# Patient Record
Sex: Male | Born: 1975 | Race: White | Hispanic: No | Smoking: Current every day smoker
Health system: Southern US, Community
[De-identification: ages and names within clinical notes are randomized; demographics above are authoritative.]

## PROBLEM LIST (undated history)

## (undated) DIAGNOSIS — K7460 Unspecified cirrhosis of liver: Secondary | ICD-10-CM

## (undated) HISTORY — PX: PERITONEAL CATHETER INSERTION: SHX2223

## (undated) HISTORY — PX: APPENDECTOMY: SHX54

---

## 2019-01-15 ENCOUNTER — Emergency Department (HOSPITAL_BASED_OUTPATIENT_CLINIC_OR_DEPARTMENT_OTHER): Payer: Self-pay

## 2019-01-15 ENCOUNTER — Encounter (HOSPITAL_BASED_OUTPATIENT_CLINIC_OR_DEPARTMENT_OTHER): Payer: Self-pay | Admitting: Emergency Medicine

## 2019-01-15 ENCOUNTER — Emergency Department (HOSPITAL_BASED_OUTPATIENT_CLINIC_OR_DEPARTMENT_OTHER)
Admission: EM | Admit: 2019-01-15 | Discharge: 2019-01-15 | Disposition: A | Discharge status: Home / Self Care | Payer: Self-pay | Attending: Emergency Medicine | Admitting: Emergency Medicine

## 2019-01-15 ENCOUNTER — Other Ambulatory Visit: Payer: Self-pay

## 2019-01-15 DIAGNOSIS — F17210 Nicotine dependence, cigarettes, uncomplicated: Secondary | ICD-10-CM | POA: Insufficient documentation

## 2019-01-15 DIAGNOSIS — R0789 Other chest pain: Secondary | ICD-10-CM | POA: Insufficient documentation

## 2019-01-15 DIAGNOSIS — J020 Streptococcal pharyngitis: Secondary | ICD-10-CM | POA: Insufficient documentation

## 2019-01-15 HISTORY — DX: Unspecified cirrhosis of liver: K74.60

## 2019-01-15 LAB — GROUP A STREP BY PCR: Group A Strep by PCR: DETECTED — AB

## 2019-01-15 LAB — COMPREHENSIVE METABOLIC PANEL
ALT: 41 U/L (ref 0–44)
AST: 60 U/L — ABNORMAL HIGH (ref 15–41)
Albumin: 3.5 g/dL (ref 3.5–5.0)
Alkaline Phosphatase: 117 U/L (ref 38–126)
Anion gap: 9 (ref 5–15)
BUN: 10 mg/dL (ref 6–20)
CO2: 23 mmol/L (ref 22–32)
Calcium: 9.4 mg/dL (ref 8.9–10.3)
Chloride: 108 mmol/L (ref 98–111)
Creatinine, Ser: 0.66 mg/dL (ref 0.61–1.24)
GFR calc Af Amer: >60 mL/min (ref >60)
GFR calc non Af Amer: >60 mL/min (ref >60)
Glucose, Bld: 125 mg/dL — ABNORMAL HIGH (ref 70–99)
Potassium: 3.7 mmol/L (ref 3.5–5.1)
Sodium: 140 mmol/L (ref 135–145)
Total Bilirubin: 1.1 mg/dL (ref 0.3–1.2)
Total Protein: 6.9 g/dL (ref 6.5–8.1)

## 2019-01-15 LAB — CBC WITH DIFFERENTIAL/PLATELET
Abs Immature Granulocytes: 0.05 10*3/uL (ref 0.00–0.07)
Basophils Absolute: 0.1 10*3/uL (ref 0.0–0.1)
Basophils Relative: 1 %
Eosinophils Absolute: 0.2 10*3/uL (ref 0.0–0.5)
Eosinophils Relative: 3 %
HCT: 49.4 % (ref 39.0–52.0)
Hemoglobin: 16.3 g/dL (ref 13.0–17.0)
Immature Granulocytes: 1 %
Lymphocytes Relative: 22 %
Lymphs Abs: 2.1 10*3/uL (ref 0.7–4.0)
MCH: 31.3 pg (ref 26.0–34.0)
MCHC: 33 g/dL (ref 30.0–36.0)
MCV: 94.8 fL (ref 80.0–100.0)
Monocytes Absolute: 1.1 10*3/uL — ABNORMAL HIGH (ref 0.1–1.0)
Monocytes Relative: 12 %
Neutro Abs: 5.8 10*3/uL (ref 1.7–7.7)
Neutrophils Relative %: 61 %
Platelets: 66 10*3/uL — ABNORMAL LOW (ref 150–400)
RBC: 5.21 MIL/uL (ref 4.22–5.81)
RDW: 13.8 % (ref 11.5–15.5)
Smear Review: DECREASED
WBC: 9.3 10*3/uL (ref 4.0–10.5)
nRBC: 0 % (ref 0.0–0.2)

## 2019-01-15 LAB — TROPONIN I (HIGH SENSITIVITY)
Troponin I (High Sensitivity): 23 ng/L — ABNORMAL HIGH (ref <18)
Troponin I (High Sensitivity): 21 ng/L — ABNORMAL HIGH (ref <18)

## 2019-01-15 MED ORDER — DEXAMETHASONE SODIUM PHOSPHATE 10 MG/ML IJ SOLN
10.0000 mg | Freq: Once | INTRAMUSCULAR | Status: AC
  Administered 2019-01-15: 05:00:00 10 mg via INTRAVENOUS
  Filled 2019-01-15: qty 1

## 2019-01-15 MED ORDER — PENICILLIN G BENZATHINE 1200000 UNIT/2ML IM SUSP
1.2000 10*6.[IU] | Freq: Once | INTRAMUSCULAR | Status: AC
  Administered 2019-01-15: 05:00:00 1.2 10*6.[IU] via INTRAMUSCULAR
  Filled 2019-01-15: qty 2

## 2019-01-15 MED ORDER — SODIUM CHLORIDE 0.9 % IV BOLUS (SEPSIS)
1000.0000 mL | Freq: Once | INTRAVENOUS | Status: AC
  Administered 2019-01-15: 05:00:00 1000 mL via INTRAVENOUS

## 2019-01-15 MED ORDER — KETOROLAC TROMETHAMINE 30 MG/ML IJ SOLN
30.0000 mg | Freq: Once | INTRAMUSCULAR | Status: AC
  Administered 2019-01-15: 05:00:00 30 mg via INTRAVENOUS
  Filled 2019-01-15: qty 1

## 2019-01-15 NOTE — Discharge Instructions (Addendum)
You may alternate Tylenol 1000 mg every 6 hours as needed for pain and Ibuprofen 800 mg every 8 hours as needed for pain.  Please take Ibuprofen with food. ° °Steps to find a Primary Care Provider (PCP): ° °Call [phone] or [phone] to access "[place] [place] Find a Doctor Service." ° °2.  You may also go on the [facility] website at [email or web address] ° °3.  [facility] also frequently accepts new patients. ° °[facility]  °[address] °[place] [address] °[phone] ° °4.  There are also multiple [facility], [facility] and [facility] practices throughout [place] that are frequently accepting new patients. You may find a clinic that is close to your home and contact them. ° °[facility] °[email or web address] °[phone] ° °[facility] °[email or web address] ° °[facility] °[email or web address] °[phone] ° °[facility] °[email or web address] °[phone] ° °5.  Local Health Departments also can provide primary care services. ° °[facility]  °[address] °[place] [address] [phone] ° °[facility] °[address] °[place] [address] [phone] ° °[facility] °[address]  °[place] [address] °[phone] ° ° °

## 2019-01-15 NOTE — ED Notes (Signed)
ED Provider at bedside. 

## 2019-01-15 NOTE — ED Triage Notes (Signed)
Chest pain shortness of breath and sore throat for 5 days. Denies fevers at home. Hx cirrhosis. Denies cardiac hx.

## 2019-01-15 NOTE — ED Notes (Signed)
Also complaining of headache for seven days.

## 2019-01-15 NOTE — ED Provider Notes (Signed)
CHIEF COMPLAINT: Cough, chest pain, shortness of breath, sore throat, body aches, chills  HPI: Patient is a 43 year old male with history of cirrhosis, obesity who presents to the emergency department with 3 days of nonproductive cough, diffuse chest tightness, shortness of breath, sore throat, body aches, chills.  No known fevers.  No vomiting or diarrhea.  No abdominal pain.  States he just moved here from [place].  No sick contacts.  He is not aware of any COVID exposures.  No history of CAD, PE or DVT.  ROS: See HPI Constitutional: no fever  Eyes: no drainage  ENT: no runny nose   Cardiovascular:   chest pain  Resp:  SOB  GI: no vomiting GU: no dysuria Integumentary: no rash  Allergy: no hives  Musculoskeletal: no leg swelling  Neurological: no slurred speech ROS otherwise negative  PAST MEDICAL HISTORY/PAST SURGICAL HISTORY:  Past Medical History:  Diagnosis Date  . Cirrhosis (HCC)     MEDICATIONS:  Prior to Admission medications   Medication Sig Start Date End Date Taking? Authorizing Provider  acetaminophen (TYLENOL) 325 MG tablet Take 650 mg by mouth every 6 (six) hours as needed for moderate pain.   Yes [provider]    ALLERGIES:  No Known Allergies  SOCIAL HISTORY:  Social History   Tobacco Use  . Smoking status: Current Every Day Smoker    Packs/day: 0.50    Types: Cigarettes  . Smokeless tobacco: Never Used  Substance Use Topics  . Alcohol use: Yes    Comment: socially    FAMILY HISTORY: No family history on file.  EXAM: BP (!) 162/100 (BP Location: Right Arm)   Pulse 100   Temp 98.5 F (36.9 C) (Oral)   Resp 15   Ht 5\' 10"  (1.778 m)   Wt 117.9 kg   SpO2 97%   BMI 37.31 kg/m  CONSTITUTIONAL: Alert and oriented and responds appropriately to questions. Well-appearing; well-nourished HEAD: Normocephalic EYES: Conjunctivae clear, pupils appear equal, EOMI ENT: normal nose; moist mucous membranes; patient has pharyngeal erythema  without petechiae, bilateral tonsillar hypertrophy that is mild with exudate, no uvular deviation, no unilateral swelling, no trismus or drooling, no muffled voice, normal phonation, no stridor, no dental caries present, no drainable dental abscess noted, no Ludwig's angina, tongue sits flat in the bottom of the mouth, no angioedema, no facial erythema or warmth, no facial swelling; no pain with movement of the neck. NECK: Supple, no meningismus, no nuchal rigidity, no LAD  CARD: RRR; S1 and S2 appreciated; no murmurs, no clicks, no rubs, no gallops RESP: Normal chest excursion without splinting or tachypnea; breath sounds clear and equal bilaterally; no wheezes, no rhonchi, no rales, no hypoxia or respiratory distress, speaking full sentences ABD/GI: Normal bowel sounds; non-distended; soft, non-tender, no rebound, no guarding, no peritoneal signs, no hepatosplenomegaly BACK:  The back appears normal and is non-tender to palpation, there is no CVA tenderness EXT: Normal ROM in all joints; non-tender to palpation; no edema; normal capillary refill; no cyanosis, no calf tenderness or swelling    SKIN: Normal color for age and race; warm; no rash NEURO: Moves all extremities equally PSYCH: The patient's mood and manner are appropriate. Grooming and personal hygiene are appropriate.  MEDICAL DECISION MAKING: Patient here with signs of pharyngitis.  Discussed with patient that this could be strep pharyngitis but differential also includes pneumonia, COVID, other viral respiratory infection.  He does complain of some chest tightness and shortness of breath which may be related  to his viral illness but will obtain cardiac labs.  EKG shows no ischemic abnormality.  Will obtain chest x-ray.  Will give IV fluids, Toradol for symptomatic relief.  Doubt deep space neck infection, PTA, meningitis, sepsis.  ED PROGRESS: Patient's labs show mild thrombocytopenia which may be related to his cirrhosis.  No old for  comparison.  His strep test is positive.  Will give IM penicillin and IV Decadron for symptomatic relief.  Chest x-ray is clear.  First troponin is 23.  Will repeat second troponin in 2 hours.  Patient comfortable with this plan.  Patient second troponin is 21.  I feel he is safe to be discharged home.  Recommended alternating Tylenol and Motrin for pain.  He has been given a dose of IM penicillin here and will not need antibiotics for home.  He verbalized understanding.   At this time, I do not feel there is any life-threatening condition present. I have reviewed and discussed all results (EKG, imaging, lab, urine as appropriate) and exam findings with patient/family. I have reviewed nursing notes and appropriate previous records.  I feel the patient is safe to be discharged home without further emergent workup and can continue workup as an outpatient as needed. Discussed usual and customary return precautions. Patient/family verbalize understanding and are comfortable with this plan.  Outpatient follow-up has been provided as needed. All questions have been answered.    EKG Interpretation  Date/Time:  Wednesday January 15 2019 03:50:50 EDT Ventricular Rate:  106 PR Interval:    QRS Duration: 83 QT Interval:  353 QTC Calculation: 469 R Axis:   86 Text Interpretation:  Sinus tachycardia Probable left atrial enlargement No old tracing to compare Confirmed by Ward, Cyril Mourning [phone]) on 01/15/2019 3:52:27 AM         Ward, Delice Bison, DO 01/15/19 2563

## 2019-03-29 ENCOUNTER — Other Ambulatory Visit: Payer: Self-pay

## 2019-03-29 ENCOUNTER — Emergency Department (HOSPITAL_BASED_OUTPATIENT_CLINIC_OR_DEPARTMENT_OTHER): Payer: Self-pay

## 2019-03-29 ENCOUNTER — Encounter (HOSPITAL_BASED_OUTPATIENT_CLINIC_OR_DEPARTMENT_OTHER): Payer: Self-pay | Admitting: Emergency Medicine

## 2019-03-29 ENCOUNTER — Inpatient Hospital Stay (HOSPITAL_BASED_OUTPATIENT_CLINIC_OR_DEPARTMENT_OTHER)
Admission: EM | Admit: 2019-03-29 | Discharge: 2019-03-31 | DRG: 897 | Disposition: A | Discharge status: Home / Self Care | Payer: Self-pay | Attending: Internal Medicine | Admitting: Internal Medicine

## 2019-03-29 DIAGNOSIS — Z8249 Family history of ischemic heart disease and other diseases of the circulatory system: Secondary | ICD-10-CM

## 2019-03-29 DIAGNOSIS — F10239 Alcohol dependence with withdrawal, unspecified: Secondary | ICD-10-CM | POA: Diagnosis present

## 2019-03-29 DIAGNOSIS — K7030 Alcoholic cirrhosis of liver without ascites: Secondary | ICD-10-CM | POA: Diagnosis present

## 2019-03-29 DIAGNOSIS — Z811 Family history of alcohol abuse and dependence: Secondary | ICD-10-CM

## 2019-03-29 DIAGNOSIS — Z6841 Body Mass Index (BMI) 40.0 and over, adult: Secondary | ICD-10-CM

## 2019-03-29 DIAGNOSIS — R945 Abnormal results of liver function studies: Secondary | ICD-10-CM

## 2019-03-29 DIAGNOSIS — K8020 Calculus of gallbladder without cholecystitis without obstruction: Secondary | ICD-10-CM | POA: Diagnosis present

## 2019-03-29 DIAGNOSIS — F10930 Alcohol use, unspecified with withdrawal, uncomplicated: Secondary | ICD-10-CM

## 2019-03-29 DIAGNOSIS — F10939 Alcohol use, unspecified with withdrawal, unspecified: Secondary | ICD-10-CM | POA: Diagnosis present

## 2019-03-29 DIAGNOSIS — Z20828 Contact with and (suspected) exposure to other viral communicable diseases: Secondary | ICD-10-CM | POA: Diagnosis present

## 2019-03-29 DIAGNOSIS — F17210 Nicotine dependence, cigarettes, uncomplicated: Secondary | ICD-10-CM | POA: Diagnosis present

## 2019-03-29 DIAGNOSIS — F10230 Alcohol dependence with withdrawal, uncomplicated: Principal | ICD-10-CM | POA: Diagnosis present

## 2019-03-29 DIAGNOSIS — R7989 Other specified abnormal findings of blood chemistry: Secondary | ICD-10-CM | POA: Diagnosis present

## 2019-03-29 DIAGNOSIS — D696 Thrombocytopenia, unspecified: Secondary | ICD-10-CM | POA: Diagnosis present

## 2019-03-29 DIAGNOSIS — E876 Hypokalemia: Secondary | ICD-10-CM | POA: Diagnosis present

## 2019-03-29 DIAGNOSIS — R1011 Right upper quadrant pain: Secondary | ICD-10-CM

## 2019-03-29 DIAGNOSIS — K766 Portal hypertension: Secondary | ICD-10-CM | POA: Diagnosis present

## 2019-03-29 LAB — COMPREHENSIVE METABOLIC PANEL
ALT: 90 U/L — ABNORMAL HIGH (ref 0–44)
AST: 188 U/L — ABNORMAL HIGH (ref 15–41)
Albumin: 3.8 g/dL (ref 3.5–5.0)
Alkaline Phosphatase: 148 U/L — ABNORMAL HIGH (ref 38–126)
Anion gap: 13 (ref 5–15)
BUN: 6 mg/dL (ref 6–20)
CO2: 22 mmol/L (ref 22–32)
Calcium: 9.4 mg/dL (ref 8.9–10.3)
Chloride: 105 mmol/L (ref 98–111)
Creatinine, Ser: 0.7 mg/dL (ref 0.61–1.24)
GFR calc Af Amer: >60 mL/min (ref >60)
GFR calc non Af Amer: >60 mL/min (ref >60)
Glucose, Bld: 107 mg/dL — ABNORMAL HIGH (ref 70–99)
Potassium: 3.2 mmol/L — ABNORMAL LOW (ref 3.5–5.1)
Sodium: 140 mmol/L (ref 135–145)
Total Bilirubin: 2.4 mg/dL — ABNORMAL HIGH (ref 0.3–1.2)
Total Protein: 7.6 g/dL (ref 6.5–8.1)

## 2019-03-29 LAB — RAPID URINE DRUG SCREEN, HOSP PERFORMED
Amphetamines: NOT DETECTED
Barbiturates: NOT DETECTED
Benzodiazepines: NOT DETECTED
Cocaine: NOT DETECTED
Opiates: NOT DETECTED
Tetrahydrocannabinol: NOT DETECTED

## 2019-03-29 LAB — URINALYSIS, ROUTINE W REFLEX MICROSCOPIC
Glucose, UA: NEGATIVE mg/dL
Hgb urine dipstick: NEGATIVE
Ketones, ur: 15 mg/dL — AB
Leukocytes,Ua: NEGATIVE
Nitrite: NEGATIVE
Protein, ur: NEGATIVE mg/dL
Specific Gravity, Urine: 1.02 (ref 1.005–1.030)
pH: 6.5 (ref 5.0–8.0)

## 2019-03-29 LAB — LACTIC ACID, PLASMA
Lactic Acid, Venous: 2.5 mmol/L (ref 0.5–1.9)
Lactic Acid, Venous: 1.8 mmol/L (ref 0.5–1.9)

## 2019-03-29 LAB — SARS CORONAVIRUS 2 (TAT 6-24 HRS): SARS Coronavirus 2: NEGATIVE

## 2019-03-29 LAB — CBC WITH DIFFERENTIAL/PLATELET
Abs Immature Granulocytes: 0.02 10*3/uL (ref 0.00–0.07)
Basophils Absolute: 0 10*3/uL (ref 0.0–0.1)
Basophils Relative: 1 %
Eosinophils Absolute: 0.3 10*3/uL (ref 0.0–0.5)
Eosinophils Relative: 6 %
HCT: 50.6 % (ref 39.0–52.0)
Hemoglobin: 17.3 g/dL — ABNORMAL HIGH (ref 13.0–17.0)
Immature Granulocytes: 0 %
Lymphocytes Relative: 23 %
Lymphs Abs: 1.3 10*3/uL (ref 0.7–4.0)
MCH: 32.7 pg (ref 26.0–34.0)
MCHC: 34.2 g/dL (ref 30.0–36.0)
MCV: 95.7 fL (ref 80.0–100.0)
Monocytes Absolute: 0.9 10*3/uL (ref 0.1–1.0)
Monocytes Relative: 16 %
Neutro Abs: 3 10*3/uL (ref 1.7–7.7)
Neutrophils Relative %: 54 %
Platelets: 61 10*3/uL — ABNORMAL LOW (ref 150–400)
RBC: 5.29 MIL/uL (ref 4.22–5.81)
RDW: 13.5 % (ref 11.5–15.5)
WBC: 5.6 10*3/uL (ref 4.0–10.5)
nRBC: 0 % (ref 0.0–0.2)

## 2019-03-29 LAB — PROTIME-INR
INR: 1.3 — ABNORMAL HIGH (ref 0.8–1.2)
Prothrombin Time: 16.5 seconds — ABNORMAL HIGH (ref 11.4–15.2)

## 2019-03-29 LAB — ETHANOL: Alcohol, Ethyl (B): 92 mg/dL — ABNORMAL HIGH (ref <10)

## 2019-03-29 LAB — AMMONIA: Ammonia: 86 umol/L — ABNORMAL HIGH (ref 9–35)

## 2019-03-29 LAB — BRAIN NATRIURETIC PEPTIDE: B Natriuretic Peptide: 43.9 pg/mL (ref 0.0–100.0)

## 2019-03-29 LAB — HIV ANTIBODY (ROUTINE TESTING W REFLEX): HIV Screen 4th Generation wRfx: NONREACTIVE

## 2019-03-29 MED ORDER — IOHEXOL 350 MG/ML SOLN
100.0000 mL | Freq: Once | INTRAVENOUS | Status: AC | PRN
  Administered 2019-03-29: 100 mL via INTRAVENOUS

## 2019-03-29 MED ORDER — SODIUM CHLORIDE 0.9 % IV SOLN
INTRAVENOUS | Status: AC
  Administered 2019-03-29: 23:00:00 via INTRAVENOUS

## 2019-03-29 MED ORDER — LORAZEPAM 2 MG/ML IJ SOLN
1.0000 mg | Freq: Once | INTRAMUSCULAR | Status: AC
  Administered 2019-03-29: 1 mg via INTRAVENOUS
  Filled 2019-03-29: qty 1

## 2019-03-29 MED ORDER — FENTANYL CITRATE (PF) 100 MCG/2ML IJ SOLN
100.0000 ug | Freq: Once | INTRAMUSCULAR | Status: AC
  Administered 2019-03-29: 100 ug via INTRAVENOUS
  Filled 2019-03-29: qty 2

## 2019-03-29 MED ORDER — OXYCODONE HCL 5 MG PO TABS
5.0000 mg | ORAL_TABLET | Freq: Four times a day (QID) | ORAL | Status: DC | PRN
  Administered 2019-03-29 – 2019-03-30 (×3): 5 mg via ORAL
  Filled 2019-03-29 (×3): qty 1

## 2019-03-29 MED ORDER — LORAZEPAM 2 MG/ML IJ SOLN: 0.0000 mg | Freq: Two times a day (BID) | INTRAMUSCULAR | Status: DC

## 2019-03-29 MED ORDER — ONDANSETRON HCL 4 MG/2ML IJ SOLN: 4.0000 mg | Freq: Four times a day (QID) | INTRAMUSCULAR | Status: DC | PRN

## 2019-03-29 MED ORDER — MORPHINE SULFATE (PF) 4 MG/ML IV SOLN
4.0000 mg | Freq: Once | INTRAVENOUS | Status: AC
  Administered 2019-03-29: 4 mg via INTRAVENOUS
  Filled 2019-03-29: qty 1

## 2019-03-29 MED ORDER — LORAZEPAM 1 MG PO TABS: 0.0000 mg | ORAL_TABLET | Freq: Two times a day (BID) | ORAL | Status: DC

## 2019-03-29 MED ORDER — ONDANSETRON HCL 4 MG PO TABS: 4.0000 mg | ORAL_TABLET | Freq: Four times a day (QID) | ORAL | Status: DC | PRN

## 2019-03-29 MED ORDER — THIAMINE HCL 100 MG/ML IJ SOLN
100.0000 mg | Freq: Every day | INTRAMUSCULAR | Status: DC
  Administered 2019-03-29: 100 mg via INTRAVENOUS
  Filled 2019-03-29 (×2): qty 2

## 2019-03-29 MED ORDER — LORAZEPAM 2 MG/ML IJ SOLN
0.0000 mg | Freq: Four times a day (QID) | INTRAMUSCULAR | Status: DC
  Administered 2019-03-29: 2 mg via INTRAVENOUS
  Filled 2019-03-29: qty 1

## 2019-03-29 MED ORDER — LORAZEPAM 2 MG/ML IJ SOLN
2.0000 mg | Freq: Once | INTRAMUSCULAR | Status: AC
  Administered 2019-03-29: 2 mg via INTRAVENOUS
  Filled 2019-03-29: qty 1

## 2019-03-29 MED ORDER — VITAMIN B-1 100 MG PO TABS
100.0000 mg | ORAL_TABLET | Freq: Every day | ORAL | Status: DC
  Administered 2019-03-30: 100 mg via ORAL
  Filled 2019-03-29: qty 1

## 2019-03-29 MED ORDER — SODIUM CHLORIDE 0.9 % IV BOLUS
1000.0000 mL | Freq: Once | INTRAVENOUS | Status: AC
  Administered 2019-03-29: 08:00:00 1000 mL via INTRAVENOUS

## 2019-03-29 MED ORDER — FENTANYL CITRATE (PF) 100 MCG/2ML IJ SOLN
100.0000 ug | Freq: Once | INTRAMUSCULAR | Status: AC
  Administered 2019-03-29: 11:00:00 100 ug via INTRAVENOUS
  Filled 2019-03-29: qty 2

## 2019-03-29 MED ORDER — LORAZEPAM 2 MG/ML IJ SOLN: 1.0000 mg | Freq: Once | INTRAMUSCULAR | Status: DC

## 2019-03-29 MED ORDER — LORAZEPAM 1 MG PO TABS
0.0000 mg | ORAL_TABLET | Freq: Four times a day (QID) | ORAL | Status: DC
  Administered 2019-03-29 – 2019-03-30 (×3): 1 mg via ORAL
  Filled 2019-03-29 (×3): qty 1

## 2019-03-29 NOTE — Progress Notes (Signed)
Spoke with Dr. Rodena Piety admitting MD who restated that the pt is a PUI admission and is not an appropriate admission for our unit as we do not have negative pressure rooms, Rodena Piety, MD stated pt will need to go to a unit with the appropriate room/precautions the pt is needing at this time until he is ruled out as a PUI.

## 2019-03-29 NOTE — ED Notes (Signed)
Called Cassie with [facility], regarding hospitalist consult

## 2019-03-29 NOTE — ED Notes (Signed)
Lactic Acid 2.5, results given to Su Grand and MD

## 2019-03-29 NOTE — ED Notes (Signed)
Pt called out, c/o of feeling anxious again. EDP made aware

## 2019-03-29 NOTE — ED Notes (Signed)
Date and time results received: 03/29/19 0855    Test:lactic Critical Value: 2.5 Name of Provider Notified: Little Orders Received? Or Actions Taken?: no orders given

## 2019-03-29 NOTE — ED Notes (Signed)
Report given to [facility] at this time.

## 2019-03-29 NOTE — Progress Notes (Signed)
Per Dr. Rodena Piety patient is a low risk PUI and does not need to be on airborne precautions.

## 2019-03-29 NOTE — H&P (Signed)
Dale English GNF:[id number] DOB: 1976-06-02 DOA: 03/29/2019     PCP: Patient, No Pcp Per   Outpatient Specialists:   NONE    Patient arrived to ER on 03/29/19 at 0714  Patient coming from: home Lives with SO    Chief Complaint:     Chief Complaint  Patient presents with   Shortness of Breath   Abdominal Pain    HPI: Dale English is a 43 y.o. male with medical history significant of EtOh abuse, cirrhosis    Presented with  Shortness of breath and Right side abd pain For 1 wk With n/V unable to keep down PO Feeling shaky and his heart is racing, reports abdominal swelling Prior hx of ETOH withdrawal seizures  also endorse 1wk of cough and runny nose No fever or chills  Patient reports years ago he was diagnosed with cirrhosis secondary to alcohol abuse He was in severe liver failure with ascites requiring repeated draining. Patient was able to be sober for quite some time and improved stabilizing Up until few months ago then they recurred He has been drinking heavily up until a week ago when he realized that he needs to stop He did feel tremulous and had couple beers couple days ago to try to help his symptoms Prior to this he did was a heavy drinker since teenage years up until previous attempt at stopping  Patient has continuous tobacco abuse also interested in quitting at that time He reports occasional bleeding when he brushes his teeth No rashes No leg swelling  Infectious risk factors:  Reports shortness of breath, URI symptoms  In  ER RAPID COVID TEST NEGATIVE   Lab Results  Component Value Date   SARSCOV2NAA NEGATIVE 03/29/2019    Regarding pertinent Chronic problems:     etoh abuse with hx of DT's  While in ER:  Initially tachycardic up to 122 Elevated LFT's EtOH 92 Lactic acid 2.5 plt 61 INR 1.3 Ultrasound shows cholelithiasis without evidence of cholecystitis  The following Work up has been ordered so far:  Orders Placed This  Encounter  Procedures   Critical Care   Culture, blood (routine x 2)   SARS CORONAVIRUS 2 (TAT 6-24 HRS) Nasopharyngeal Nasopharyngeal Swab   DG Chest Port 1 View   US Abdomen Limited RUQ   CT Angio Chest PE W/Cm &/Or Wo Cm   CT Abdomen Pelvis W Contrast   Comprehensive metabolic panel   Ethanol   Lactic acid, plasma   CBC with Differential   Urinalysis, Routine w reflex microscopic   Urine rapid drug screen (hosp performed)   Brain natriuretic peptide   Protime-INR   HIV Antibody (routine testing w rflx)   Diet regular Room service appropriate? Yes; Fluid consistency: Thin   Cardiac monitoring   Clinical institute withdrawal assessment every 6 hours X 48 hours, then every 12 hours x 48 hours   Notify MD if CIWA-AR is greater than 10 for 2 consecutive assessments.   May administer Ativan PO versus IV if patient is tolerating PO intake well   Vital signs every 6 hours X 48 hours, then per unit protocol   Vital signs   Notify physician   Initiate Oral Care Protocol   Initiate Carrier Fluid Protocol   RN may order General Admission PRN Orders utilizing "General Admission PRN medications" (through manage orders) for the following patient needs: allergy symptoms (Claritin), cold sores (Carmex), cough (Robitussin DM), eye irritation (Liquifilm Tears), hemorrhoids (Tucks), indigestion (Maalox), minor skin  irritation (Hydrocortisone Cream), muscle pain Ephraim Hamburger), nose irritation (saline nasal spray) and sore throat (Chloraseptic spray).   SCDs   Vital signs   Notify physician   Initiate Oral Care Protocol   Initiate Carrier Fluid Protocol   RN may order General Admission PRN Orders utilizing "General Admission PRN medications" (through manage orders) for the following patient needs: allergy symptoms (Claritin), cold sores (Carmex), cough (Robitussin DM), eye irritation (Liquifilm Tears), hemorrhoids (Tucks), indigestion (Maalox), minor skin irritation  (Hydrocortisone Cream), muscle pain Suezanne Jacquet Gay), nose irritation (saline nasal spray) and sore throat (Chloraseptic spray).   SCDs   Full code   Consult to hospitalist  ALL PATIENTS BEING ADMITTED/HAVING PROCEDURES NEED COVID-19 SCREENING   Pulse oximetry, continuous   EKG 12-Lead   ED EKG   Saline lock IV   Admit to Inpatient (patient's expected length of stay will be greater than 2 midnights or inpatient only procedure)   Admit to Inpatient (patient's expected length of stay will be greater than 2 midnights or inpatient only procedure)     Following Medications were ordered in ER: Medications  LORazepam (ATIVAN) injection 0-4 mg ( Intravenous See Alternative 03/29/19 1935)    Or  LORazepam (ATIVAN) tablet 0-4 mg (1 mg Oral Given 03/29/19 1935)  LORazepam (ATIVAN) injection 0-4 mg (has no administration in time range)    Or  LORazepam (ATIVAN) tablet 0-4 mg (has no administration in time range)  thiamine (VITAMIN B-1) tablet 100 mg ( Oral See Alternative 03/29/19 0800)    Or  thiamine (B-1) injection 100 mg (100 mg Intravenous Given 03/29/19 0800)  oxyCODONE (Oxy IR/ROXICODONE) immediate release tablet 5 mg (5 mg Oral Given 03/29/19 1708)  sodium chloride 0.9 % bolus 1,000 mL (0 mLs Intravenous Stopped 03/29/19 0920)  fentaNYL (SUBLIMAZE) injection 100 mcg (100 mcg Intravenous Given 03/29/19 0757)  LORazepam (ATIVAN) injection 1 mg (1 mg Intravenous Given 03/29/19 0923)  fentaNYL (SUBLIMAZE) injection 100 mcg (100 mcg Intravenous Given 03/29/19 1104)  iohexol (OMNIPAQUE) 350 MG/ML injection 100 mL (100 mLs Intravenous Contrast Given 03/29/19 1139)  LORazepam (ATIVAN) injection 2 mg (2 mg Intravenous Given 03/29/19 1308)  morphine 4 MG/ML injection 4 mg (4 mg Intravenous Given 03/29/19 1328)        Consult Orders  (From admission, onward)         Start     Ordered   03/29/19 1257  Consult to hospitalist  Called [facility] at 13:01 for consult to [facility] per  [place].   Verified with [place]  Once    Comments: ALL PATIENTS BEING ADMITTED/HAVING PROCEDURES NEED COVID-19 SCREENING  Provider:  (Not yet assigned)  Question Answer Comment  Place call to: [facility]   Reason for Consult Admit      03/29/19 1256          Significant initial  Findings: Abnormal Labs Reviewed  COMPREHENSIVE METABOLIC PANEL - Abnormal; Notable for the following components:      Result Value   Potassium 3.2 (*)    Glucose, Bld 107 (*)    AST 188 (*)    ALT 90 (*)    Alkaline Phosphatase 148 (*)    Total Bilirubin 2.4 (*)    All other components within normal limits  ETHANOL - Abnormal; Notable for the following components:   Alcohol, Ethyl (B) 92 (*)    All other components within normal limits  LACTIC ACID, PLASMA - Abnormal; Notable for the following components:   Lactic Acid, Venous 2.5 (*)  All other components within normal limits  CBC WITH DIFFERENTIAL/PLATELET - Abnormal; Notable for the following components:   Hemoglobin 17.3 (*)    Platelets 61 (*)    All other components within normal limits  URINALYSIS, ROUTINE W REFLEX MICROSCOPIC - Abnormal; Notable for the following components:   Color, Urine AMBER (*)    Bilirubin Urine MODERATE (*)    Ketones, ur 15 (*)    All other components within normal limits  PROTIME-INR - Abnormal; Notable for the following components:   Prothrombin Time 16.5 (*)    INR 1.3 (*)    All other components within normal limits    Otherwise labs showing:    Recent Labs  Lab 03/29/19 0734  NA 140  K 3.2*  CO2 22  GLUCOSE 107*  BUN 6  CREATININE 0.70  CALCIUM 9.4    Cr   stable,    Lab Results  Component Value Date   CREATININE 0.70 03/29/2019   CREATININE 0.66 01/15/2019    Recent Labs  Lab 03/29/19 0734  AST 188*  ALT 90*  ALKPHOS 148*  BILITOT 2.4*  PROT 7.6  ALBUMIN 3.8   Lab Results  Component Value Date   CALCIUM 9.4 03/29/2019     WBC      Component Value Date/Time    WBC 5.6 03/29/2019 0734   ANC    Component Value Date/Time   NEUTROABS 3.0 03/29/2019 0734   ALC No components found for: LYMPHAB    Plt: Lab Results  Component Value Date   PLT 61 (L) 03/29/2019    Lactic Acid, Venous    Component Value Date/Time   LATICACIDVEN 1.8 03/29/2019 0939      COVID-19 Labs    Lab Results  Component Value Date   SARSCOV2NAA NEGATIVE 03/29/2019    HG/HCT  Up from baseline see below    Component Value Date/Time   HGB 17.3 (H) 03/29/2019 0734   HCT 50.6 03/29/2019 0734      ECG: Ordered Personally reviewed by me showing: HR : 123 Rhythm: Sinus tachycardia     no evidence of ischemic changes QTC 483   BNP (last 3 results) Recent Labs    03/29/19 0734  BNP 43.9      UA   no evidence of UTI    Urine analysis:    Component Value Date/Time   COLORURINE AMBER (A) 03/29/2019 0926   APPEARANCEUR CLEAR 03/29/2019 0926   LABSPEC 1.020 03/29/2019 0926   PHURINE 6.5 03/29/2019 0926   GLUCOSEU NEGATIVE 03/29/2019 0926   HGBUR NEGATIVE 03/29/2019 0926   BILIRUBINUR MODERATE (A) 03/29/2019 0926   KETONESUR 15 (A) 03/29/2019 0926   PROTEINUR NEGATIVE 03/29/2019 0926   NITRITE NEGATIVE 03/29/2019 0926   LEUKOCYTESUR NEGATIVE 03/29/2019 0926    Ordered  CXR - NON acute, cardiomegaly  CTabd/pelvis - diffuse hepatic steatosis. hepatic cirrhosis portal hypertension  CTA chest  nonacute, no PE, no evidence of infiltrate   [place] gall stones and sludge, hepatic steatosis One vessel coronary atherosclerosis.     ED Triage Vitals  Enc Vitals Group     BP 03/29/19 0730 (!) 141/82     Pulse Rate 03/29/19 0726 (!) 123     Resp 03/29/19 0726 (!) 32     Temp 03/29/19 0726 98.4 F (36.9 C)     Temp Source 03/29/19 0726 Oral     SpO2 03/29/19 0728 100 %     Weight 03/29/19 0724 (!) 351 lb (159.2 kg)  Height 03/29/19 0724  (1.778 m)     Head Circumference --      Peak Flow --      Pain Score 03/29/19 0723 8     Pain Loc --       Pain Edu? --      Excl. in GC? --   TMAX(24)@       Latest  Blood pressure 134/85, pulse 97, temperature 98.5 F (36.9 C), temperature source Oral, resp. rate 18, height  (1.778 m), weight (!) 159.2 kg, SpO2 99 %.     Hospitalist was called for admission for ETOH withdrawal   Review of Systems:    Pertinent positives include: abdominal pain, nausea, fatigue shortness of breath at rest.  non-productive cough, Constitutional:  No weight loss, night sweats, Fevers, chills, , weight loss  HEENT:  No headaches, Difficulty swallowing,Tooth/dental problems,Sore throat,  No sneezing, itching, ear ache, nasal congestion, post nasal drip,  Cardio-vascular:  No chest pain, Orthopnea, PND, anasarca, dizziness, palpitations.no Bilateral lower extremity swelling  GI:  No heartburn, indigestion,  vomiting, diarrhea, change in bowel habits, loss of appetite, melena, blood in stool, hematemesis Resp:  no No dyspnea on exertion, No excess mucus, no productive cough, No No coughing up of blood.No change in color of mucus.No wheezing. Skin:  no rash or lesions. No jaundice GU:  no dysuria, change in color of urine, no urgency or frequency. No straining to urinate.  No flank pain.  Musculoskeletal:  No joint pain or no joint swelling. No decreased range of motion. No back pain.  Psych:  No change in mood or affect. No depression or anxiety. No memory loss.  Neuro: no localizing neurological complaints, no tingling, no weakness, no double vision, no gait abnormality, no slurred speech, no confusion  All systems reviewed and apart from HOPI all are negative  Past Medical History:   Past Medical History:  Diagnosis Date   Cirrhosis (HCC)      Past Surgical History:  Procedure Laterality Date   APPENDECTOMY     PERITONEAL CATHETER INSERTION      Social History:  Ambulatory  independently       reports that he has been smoking cigarettes. He has been smoking about 0.50 packs  per day. He has never used smokeless tobacco. He reports current alcohol use. He reports previous drug use.    Family History:   Family History  Problem Relation Age of Onset   Alcohol abuse Mother    Alcohol abuse Father    Hypertension Father     Allergies: No Known Allergies   Prior to Admission medications   Medication Sig Start Date End Date Taking? Authorizing Provider  acetaminophen (TYLENOL) 325 MG tablet Take 650 mg by mouth every 6 (six) hours as needed for moderate pain.    [provider]   Physical Exam: Blood pressure 134/85, pulse 97, temperature 98.5 F (36.9 C), temperature source Oral, resp. rate 18, height  (1.778 m), weight (!) 159.2 kg, SpO2 99 %. 1. General:  in No Acute distress   Chronically ill -appearing 2. Psychological: Alert and  Oriented to self and situation 3. Head/ENT:    Dry Mucous Membranes                          Head Non traumatic, neck supple  Poor Dentition 4. SKIN:  decreased Skin turgor,  Skin clean Dry and intact no rash 5. Heart: Regular rate and rhythm no  Murmur, no Rub or gallop 6. Lungs:  no wheezes or crackles   7. Abdomen: Soft tender,   Distended bowel sounds present 8. Lower extremities: no clubbing, cyanosis, no edema 9. Neurologically Grossly intact, moving all 4 extremities equally slightly tremouluopus  10. MSK: Normal range of motion   All other LABS:     Recent Labs  Lab 03/29/19 0734  WBC 5.6  NEUTROABS 3.0  HGB 17.3*  HCT 50.6  MCV 95.7  PLT 61*     Recent Labs  Lab 03/29/19 0734  NA 140  K 3.2*  CL 105  CO2 22  GLUCOSE 107*  BUN 6  CREATININE 0.70  CALCIUM 9.4     Recent Labs  Lab 03/29/19 0734  AST 188*  ALT 90*  ALKPHOS 148*  BILITOT 2.4*  PROT 7.6  ALBUMIN 3.8       Cultures: No results found for: SDES, SPECREQUEST, CULT, REPTSTATUS   Radiological Exams on Admission: Ct Angio Chest Pe W/cm &/or Wo Cm  Result Date:  03/29/2019 CLINICAL DATA:  Dyspnea. Hilar prominence on chest radiograph. Right upper quadrant abdominal pain, nausea and vomiting. History of cirrhosis. EXAM: CT ANGIOGRAPHY CHEST CT ABDOMEN AND PELVIS WITH CONTRAST TECHNIQUE: Multidetector CT imaging of the chest was performed using the standard protocol during bolus administration of intravenous contrast. Multiplanar CT image reconstructions and MIPs were obtained to evaluate the vascular anatomy. Multidetector CT imaging of the abdomen and pelvis was performed using the standard protocol during bolus administration of intravenous contrast. CONTRAST:  OMNIPAQUE IOHEXOL 350 MG/ML SOLN COMPARISON:  Chest radiograph and right upper quadrant abdominal sonogram from earlier today. FINDINGS: CTA CHEST FINDINGS Cardiovascular: The study is low-to-moderate quality for the evaluation of pulmonary embolism, degraded by motion and suboptimal contrast opacification. There are no convincing filling defects in the central, lobar, segmental or subsegmental pulmonary artery branches to suggest acute pulmonary embolism. Normal course and caliber of the thoracic aorta. Prominently dilated main pulmonary artery (4.3 cm diameter). Top-normal heart size. No significant pericardial fluid/thickening. Left anterior descending coronary atherosclerosis. Mediastinum/Nodes: No discrete thyroid nodules. Unremarkable esophagus. No pathologically enlarged axillary, mediastinal or hilar lymph nodes. Lungs/Pleura: No pneumothorax. No pleural effusion. No acute consolidative airspace disease, lung masses or significant pulmonary nodules. Musculoskeletal: No aggressive appearing focal osseous lesions. Mild thoracic spondylosis. Review of the MIP images confirms the above findings. CT ABDOMEN and PELVIS FINDINGS Hepatobiliary: Severe diffuse hepatic steatosis. Diffusely irregular liver surface with relative hypertrophy of the caudate lobe, compatible with hepatic cirrhosis. Patchy lace-like  enhancement throughout the liver, compatible with confluent hepatic fibrosis. No discrete liver mass. No radiopaque cholelithiasis. No definite gallbladder wall thickening. No pericholecystic fluid. No biliary ductal dilatation. Pancreas: Normal, with no mass or duct dilation. Spleen: Normal size. No mass. Adrenals/Urinary Tract: No discrete adrenal nodules. Normal kidneys with no hydronephrosis and no renal mass. Normal bladder. Stomach/Bowel: Normal non-distended stomach. Normal caliber small bowel with no small bowel wall thickening. Apparent appendectomy. Normal large bowel with no diverticulosis, large bowel wall thickening or pericolonic fat stranding. Vascular/Lymphatic: Atherosclerotic nonaneurysmal abdominal aorta. Patent portal, splenic, hepatic and renal veins. Moderate paraumbilical, small esophageal and large left retroperitoneal (connecting the SMV to the left renal vein) varices. No pathologically enlarged lymph nodes in the abdomen or pelvis. Reproductive: Normal size prostate. Other: No pneumoperitoneum, ascites or focal fluid collection. Musculoskeletal: No aggressive  appearing focal osseous lesions. Review of the MIP images confirms the above findings. IMPRESSION: 1. No evidence of pulmonary embolism. 2. Prominently dilated main pulmonary artery, suggesting pulmonary arterial hypertension. 3. Severe diffuse hepatic steatosis. Patchy lace-like enhancement throughout the liver, compatible with confluent hepatic fibrosis. Prominent morphologic changes of hepatic cirrhosis. No discrete liver mass. Suggest MRI abdomen without and with IV contrast for liver screening in 3-6 months. 4. Moderate paraumbilical, small esophageal and large left retroperitoneal varices, indicative of portal hypertension. Normal size spleen. No ascites. 5. No evidence of bowel obstruction or acute bowel inflammation. 6. One vessel coronary atherosclerosis. 7. Aortic Atherosclerosis (ICD10-I70.0). Electronically Signed   By:  Delbert Phenix M.D.   On: 03/29/2019 12:49   Ct Abdomen Pelvis W Contrast  Result Date: 03/29/2019 CLINICAL DATA:  Dyspnea. Hilar prominence on chest radiograph. Right upper quadrant abdominal pain, nausea and vomiting. History of cirrhosis. EXAM: CT ANGIOGRAPHY CHEST CT ABDOMEN AND PELVIS WITH CONTRAST TECHNIQUE: Multidetector CT imaging of the chest was performed using the standard protocol during bolus administration of intravenous contrast. Multiplanar CT image reconstructions and MIPs were obtained to evaluate the vascular anatomy. Multidetector CT imaging of the abdomen and pelvis was performed using the standard protocol during bolus administration of intravenous contrast. CONTRAST:  OMNIPAQUE IOHEXOL 350 MG/ML SOLN COMPARISON:  Chest radiograph and right upper quadrant abdominal sonogram from earlier today. FINDINGS: CTA CHEST FINDINGS Cardiovascular: The study is low-to-moderate quality for the evaluation of pulmonary embolism, degraded by motion and suboptimal contrast opacification. There are no convincing filling defects in the central, lobar, segmental or subsegmental pulmonary artery branches to suggest acute pulmonary embolism. Normal course and caliber of the thoracic aorta. Prominently dilated main pulmonary artery (4.3 cm diameter). Top-normal heart size. No significant pericardial fluid/thickening. Left anterior descending coronary atherosclerosis. Mediastinum/Nodes: No discrete thyroid nodules. Unremarkable esophagus. No pathologically enlarged axillary, mediastinal or hilar lymph nodes. Lungs/Pleura: No pneumothorax. No pleural effusion. No acute consolidative airspace disease, lung masses or significant pulmonary nodules. Musculoskeletal: No aggressive appearing focal osseous lesions. Mild thoracic spondylosis. Review of the MIP images confirms the above findings. CT ABDOMEN and PELVIS FINDINGS Hepatobiliary: Severe diffuse hepatic steatosis. Diffusely irregular liver surface with  relative hypertrophy of the caudate lobe, compatible with hepatic cirrhosis. Patchy lace-like enhancement throughout the liver, compatible with confluent hepatic fibrosis. No discrete liver mass. No radiopaque cholelithiasis. No definite gallbladder wall thickening. No pericholecystic fluid. No biliary ductal dilatation. Pancreas: Normal, with no mass or duct dilation. Spleen: Normal size. No mass. Adrenals/Urinary Tract: No discrete adrenal nodules. Normal kidneys with no hydronephrosis and no renal mass. Normal bladder. Stomach/Bowel: Normal non-distended stomach. Normal caliber small bowel with no small bowel wall thickening. Apparent appendectomy. Normal large bowel with no diverticulosis, large bowel wall thickening or pericolonic fat stranding. Vascular/Lymphatic: Atherosclerotic nonaneurysmal abdominal aorta. Patent portal, splenic, hepatic and renal veins. Moderate paraumbilical, small esophageal and large left retroperitoneal (connecting the SMV to the left renal vein) varices. No pathologically enlarged lymph nodes in the abdomen or pelvis. Reproductive: Normal size prostate. Other: No pneumoperitoneum, ascites or focal fluid collection. Musculoskeletal: No aggressive appearing focal osseous lesions. Review of the MIP images confirms the above findings. IMPRESSION: 1. No evidence of pulmonary embolism. 2. Prominently dilated main pulmonary artery, suggesting pulmonary arterial hypertension. 3. Severe diffuse hepatic steatosis. Patchy lace-like enhancement throughout the liver, compatible with confluent hepatic fibrosis. Prominent morphologic changes of hepatic cirrhosis. No discrete liver mass. Suggest MRI abdomen without and with IV contrast for liver screening in 3-6  months. 4. Moderate paraumbilical, small esophageal and large left retroperitoneal varices, indicative of portal hypertension. Normal size spleen. No ascites. 5. No evidence of bowel obstruction or acute bowel inflammation. 6. One vessel  coronary atherosclerosis. 7. Aortic Atherosclerosis (ICD10-I70.0). Electronically Signed   By: Delbert PhenixJason A Poff M.D.   On: 03/29/2019 12:49   Dg Chest Port 1 View  Result Date: 03/29/2019 CLINICAL DATA:  Cough and shortness of breath EXAM: PORTABLE CHEST 1 VIEW COMPARISON:  January 15, 2019 FINDINGS: There is no edema or consolidation. Heart is mildly enlarged with pulmonary vascularity grossly normal. There is fullness in each hilar region, particularly on the right. No other findings suggesting potential adenopathy. No bone lesions. IMPRESSION: No edema or consolidation.  Heart mildly enlarged. Prominence in the hilar regions may be due to prominent central pulmonary vessels. The possibility of hilar adenopathy, particular on the right, must be of concern. This area on the right appears more prominent than on prior study. Given this concern for potential adenopathy, correlation with contrast enhanced chest CT to further evaluate is felt to be warranted. No other findings suggesting potential adenopathy. Electronically Signed   By: Bretta BangWilliam  Woodruff III M.D.   On: 03/29/2019 08:49   [place] Abdomen Limited Ruq  Result Date: 03/29/2019 CLINICAL DATA:  Right upper quadrant pain. History of alcoholic cirrhosis. EXAM: ULTRASOUND ABDOMEN LIMITED RIGHT UPPER QUADRANT COMPARISON:  None. FINDINGS: Gallbladder: Gallstones measuring up to 1.2 cm. Gallbladder sludge. No gallbladder wall thickening. Negative sonographic Murphy's sign. Common bile duct: Diameter: 3 mm. Liver: Diffusely increased echogenicity. No focal abnormality. Portal vein not visualized. Sonographer reports technical limitations of the exam secondary to body habitus. Other: None. IMPRESSION: Gallstones and gallbladder sludge. Echogenic liver compatible with hepatic steatosis Electronically Signed   By: Signa Kellaylor  Stroud M.D.   On: 03/29/2019 11:11    Chart has been reviewed    Assessment/Plan  Dale MassedJames English is a 43 y.o. 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  DVT prophylaxis:  SCD    Code Status:  FULL CODE  as per patient  I had personally discussed CODE STATUS with patient    Family Communication:   Family not at  Bedside    Disposition Plan:    To home once workup is complete and patient is stable                      Would benefit from PT/OT eval prior to DC  Ordered  Social Work  consulted                   Nutrition    consulted                                     Consults called:none    Admission status:  ED Disposition    ED Disposition Condition Comment   Admit  Hospital Area: [facility] [[id number]]  Level of Care: Telemetry [5]  Admit to tele based on following criteria: Complex arrhythmia (Bradycardia/Tachycardia)  Covid Evaluation: Person Under Investigation (PUI)  Diagnosis: Alcohol withdrawal (HCC) [291.81.ICD-9-CM]  Admitting Physician: Alwyn Ren [[id number]]  Attending Physician: Alwyn Ren [[id number]]  Estimated length of stay: 3 - 4 days  Certification::  I certify this patient will need inpatient services for at least 2 midnights  PT Class (Do Not Modify): Inpatient [101]  PT Acc Code (Do Not Modify): Private [1]         inpatient     Expect 2 midnight stay secondary to severity of patient's current illness including   hemodynamic instability despite optimal treatment (tachycardia )  Severe lab/radiological/exam abnormalities including:  Cirrhosis, cholelithiasis   and extensive comorbidities including:  substance abuse  Morbid Obesity    liver disease   That are currently affecting medical management.  I expect  patient to be hospitalized for 2 midnights requiring inpatient medical care.  Patient is at high risk for adverse outcome (such as loss of life or disability) if not treated.  Indication for inpatient stay as follows:  Severe change from baseline regarding mental status Hemodynamic instability despite maximal medical therapy,  ongoing suicidal ideations,  severe pain requiring acute inpatient management,  inability to maintain oral hydration   persistent chest pain despite medical management Need for operative/procedural  intervention New or worsening hypoxia  Need for IV antibiotics, IV fluids, IV rate controling medications, IV antihypertensives, IV pain medications, IV anticoagulation    Level of care  tele  For 12H    Precautions:   No active isolations  PPE: Used by the provider:   P100  eye Goggles,  Gloves  gown    Paradise Vensel 03/30/2019, 1:12 AM    [facility]     after 2 AM please page floor coverage PA If 7AM-7PM, please contact the day team taking care of the patient using [email or web address]

## 2019-03-29 NOTE — ED Triage Notes (Addendum)
SOB and R side abd pain x 1 week. Reports he has had fluid in his abd before and he feels swollen. Reports no alcohol use in 1 week but had a period of heavy drinking prior.

## 2019-03-29 NOTE — ED Provider Notes (Signed)
[facility] EMERGENCY DEPARTMENT Provider Note   CSN: [id number] Arrival date & time: 03/29/19  0714     History   Chief Complaint Chief Complaint  Patient presents with   Shortness of Breath   Abdominal Pain    HPI Dale English is a 43 y.o. male.     43 year old male with past medical history including cirrhosis, alcohol abuse who presents with shortness of breath and right side pain.  Patient reports 1 week of progressively worsening shortness of breath associated with pain in his right side.  Recently, he has not been able to keep anything down and has had multiple episodes of vomiting.  He reports tremulousness and heart racing sensation.  Shortness of breath is worse with exertion and worse with sitting forward.  He feels swollen in his abdomen but denies any lower extremity edema.  He reports 1 week of cough and runny nose, no known fevers or sick contacts.  Last alcohol use was 1 week ago but had been drinking heavily prior to that.  He reports history of alcohol withdrawal seizure.  No other drug use.  The history is provided by the patient.  Shortness of Breath Associated symptoms: abdominal pain   Abdominal Pain Associated symptoms: shortness of breath     Past Medical History:  Diagnosis Date   Cirrhosis ([place])     There are no active problems to display for this patient.   Past Surgical History:  Procedure Laterality Date   APPENDECTOMY     PERITONEAL CATHETER INSERTION          Home Medications    Prior to Admission medications   Medication Sig Start Date End Date Taking? Authorizing Provider  acetaminophen (TYLENOL) 325 MG tablet Take 650 mg by mouth every 6 (six) hours as needed for moderate pain.    [provider]    Family History No family history on file.  Social History Social History   Tobacco Use   Smoking status: Current Every Day Smoker    Packs/day: 0.50    Types: Cigarettes   Smokeless tobacco: Never  Used  Substance Use Topics   Alcohol use: Yes   Drug use: Not Currently     Allergies   Patient has no known allergies.   Review of Systems Review of Systems  Respiratory: Positive for shortness of breath.   Gastrointestinal: Positive for abdominal pain.   All other systems reviewed and are negative except that which was mentioned in HPI   Physical Exam Updated Vital Signs BP 127/75    Pulse (!) 122    Temp 98.4 F (36.9 C) (Oral)    Resp 19    Ht 5\' 10"  (1.778 m)    Wt (!) 159.2 kg    SpO2 100%    BMI 50.36 kg/m   Physical Exam Vitals signs and nursing note reviewed.  Constitutional:      General: He is not in acute distress.    Appearance: He is well-developed. He is obese. He is ill-appearing. He is not toxic-appearing.  HENT:     Head: Normocephalic and atraumatic.  Eyes:     Conjunctiva/sclera: Conjunctivae normal.  Neck:     Musculoskeletal: Neck supple.  Cardiovascular:     Rate and Rhythm: Regular rhythm. Tachycardia present.     Heart sounds: Normal heart sounds. No murmur.  Pulmonary:     Effort: Pulmonary effort is normal.     Breath sounds: No wheezing or rales.  Comments: Diminished b/l bases Abdominal:     General: There is no distension.     Palpations: Abdomen is soft.     Tenderness: There is abdominal tenderness.     Comments: RUQ tenderness, obese abdomen without obvious ascites/fluid wave; no peritonitis  Musculoskeletal:     Right lower leg: No edema.     Left lower leg: No edema.  Skin:    General: Skin is warm and dry.  Neurological:     Mental Status: He is alert and oriented to person, place, and time.     Comments: Fluent speech, tremulous, oriented x 3  Psychiatric:        Mood and Affect: Mood normal.        Behavior: Behavior normal.        Judgment: Judgment normal.      ED Treatments / Results  Labs (all labs ordered are listed, but only abnormal results are displayed) Labs Reviewed  COMPREHENSIVE METABOLIC PANEL  - Abnormal; Notable for the following components:      Result Value   Potassium 3.2 (*)    Glucose, Bld 107 (*)    AST 188 (*)    ALT 90 (*)    Alkaline Phosphatase 148 (*)    Total Bilirubin 2.4 (*)    All other components within normal limits  ETHANOL - Abnormal; Notable for the following components:   Alcohol, Ethyl (B) 92 (*)    All other components within normal limits  LACTIC ACID, PLASMA - Abnormal; Notable for the following components:   Lactic Acid, Venous 2.5 (*)    All other components within normal limits  CBC WITH DIFFERENTIAL/PLATELET - Abnormal; Notable for the following components:   Hemoglobin 17.3 (*)    Platelets 61 (*)    All other components within normal limits  URINALYSIS, ROUTINE W REFLEX MICROSCOPIC - Abnormal; Notable for the following components:   Color, Urine AMBER (*)    Bilirubin Urine MODERATE (*)    Ketones, ur 15 (*)    All other components within normal limits  PROTIME-INR - Abnormal; Notable for the following components:   Prothrombin Time 16.5 (*)    INR 1.3 (*)    All other components within normal limits  CULTURE, BLOOD (ROUTINE X 2)  CULTURE, BLOOD (ROUTINE X 2)  SARS CORONAVIRUS 2 (TAT 6-24 HRS)  LACTIC ACID, PLASMA  RAPID URINE DRUG SCREEN, HOSP PERFORMED  BRAIN NATRIURETIC PEPTIDE    EKG EKG Interpretation  Date/Time:  Saturday March 29 2019 07:26:41 EDT Ventricular Rate:  123 PR Interval:    QRS Duration: 104 QT Interval:  337 QTC Calculation: 483 R Axis:   86 Text Interpretation:  Sinus tachycardia Borderline T abnormalities, inferior leads Borderline prolonged QT interval Baseline wander in lead(s) V1 V2 overall similar to previous Confirmed by Frederick Peers [phone]) on 03/29/2019 7:44:54 AM   Radiology Ct Angio Chest Pe W/cm &/or Wo Cm  Result Date: 03/29/2019 CLINICAL DATA:  Dyspnea. Hilar prominence on chest radiograph. Right upper quadrant abdominal pain, nausea and vomiting. History of cirrhosis. EXAM: CT  ANGIOGRAPHY CHEST CT ABDOMEN AND PELVIS WITH CONTRAST TECHNIQUE: Multidetector CT imaging of the chest was performed using the standard protocol during bolus administration of intravenous contrast. Multiplanar CT image reconstructions and MIPs were obtained to evaluate the vascular anatomy. Multidetector CT imaging of the abdomen and pelvis was performed using the standard protocol during bolus administration of intravenous contrast. CONTRAST:  OMNIPAQUE IOHEXOL 350 MG/ML SOLN COMPARISON:  Chest  radiograph and right upper quadrant abdominal sonogram from earlier today. FINDINGS: CTA CHEST FINDINGS Cardiovascular: The study is low-to-moderate quality for the evaluation of pulmonary embolism, degraded by motion and suboptimal contrast opacification. There are no convincing filling defects in the central, lobar, segmental or subsegmental pulmonary artery branches to suggest acute pulmonary embolism. Normal course and caliber of the thoracic aorta. Prominently dilated main pulmonary artery (4.3 cm diameter). Top-normal heart size. No significant pericardial fluid/thickening. Left anterior descending coronary atherosclerosis. Mediastinum/Nodes: No discrete thyroid nodules. Unremarkable esophagus. No pathologically enlarged axillary, mediastinal or hilar lymph nodes. Lungs/Pleura: No pneumothorax. No pleural effusion. No acute consolidative airspace disease, lung masses or significant pulmonary nodules. Musculoskeletal: No aggressive appearing focal osseous lesions. Mild thoracic spondylosis. Review of the MIP images confirms the above findings. CT ABDOMEN and PELVIS FINDINGS Hepatobiliary: Severe diffuse hepatic steatosis. Diffusely irregular liver surface with relative hypertrophy of the caudate lobe, compatible with hepatic cirrhosis. Patchy lace-like enhancement throughout the liver, compatible with confluent hepatic fibrosis. No discrete liver mass. No radiopaque cholelithiasis. No definite gallbladder wall  thickening. No pericholecystic fluid. No biliary ductal dilatation. Pancreas: Normal, with no mass or duct dilation. Spleen: Normal size. No mass. Adrenals/Urinary Tract: No discrete adrenal nodules. Normal kidneys with no hydronephrosis and no renal mass. Normal bladder. Stomach/Bowel: Normal non-distended stomach. Normal caliber small bowel with no small bowel wall thickening. Apparent appendectomy. Normal large bowel with no diverticulosis, large bowel wall thickening or pericolonic fat stranding. Vascular/Lymphatic: Atherosclerotic nonaneurysmal abdominal aorta. Patent portal, splenic, hepatic and renal veins. Moderate paraumbilical, small esophageal and large left retroperitoneal (connecting the SMV to the left renal vein) varices. No pathologically enlarged lymph nodes in the abdomen or pelvis. Reproductive: Normal size prostate. Other: No pneumoperitoneum, ascites or focal fluid collection. Musculoskeletal: No aggressive appearing focal osseous lesions. Review of the MIP images confirms the above findings. IMPRESSION: 1. No evidence of pulmonary embolism. 2. Prominently dilated main pulmonary artery, suggesting pulmonary arterial hypertension. 3. Severe diffuse hepatic steatosis. Patchy lace-like enhancement throughout the liver, compatible with confluent hepatic fibrosis. Prominent morphologic changes of hepatic cirrhosis. No discrete liver mass. Suggest MRI abdomen without and with IV contrast for liver screening in 3-6 months. 4. Moderate paraumbilical, small esophageal and large left retroperitoneal varices, indicative of portal hypertension. Normal size spleen. No ascites. 5. No evidence of bowel obstruction or acute bowel inflammation. 6. One vessel coronary atherosclerosis. 7. Aortic Atherosclerosis (ICD10-I70.0). Electronically Signed   By: Delbert PhenixJason A Poff M.D.   On: 03/29/2019 12:49   Ct Abdomen Pelvis W Contrast  Result Date: 03/29/2019 CLINICAL DATA:  Dyspnea. Hilar prominence on chest  radiograph. Right upper quadrant abdominal pain, nausea and vomiting. History of cirrhosis. EXAM: CT ANGIOGRAPHY CHEST CT ABDOMEN AND PELVIS WITH CONTRAST TECHNIQUE: Multidetector CT imaging of the chest was performed using the standard protocol during bolus administration of intravenous contrast. Multiplanar CT image reconstructions and MIPs were obtained to evaluate the vascular anatomy. Multidetector CT imaging of the abdomen and pelvis was performed using the standard protocol during bolus administration of intravenous contrast. CONTRAST:  100mL OMNIPAQUE IOHEXOL 350 MG/ML SOLN COMPARISON:  Chest radiograph and right upper quadrant abdominal sonogram from earlier today. FINDINGS: CTA CHEST FINDINGS Cardiovascular: The study is low-to-moderate quality for the evaluation of pulmonary embolism, degraded by motion and suboptimal contrast opacification. There are no convincing filling defects in the central, lobar, segmental or subsegmental pulmonary artery branches to suggest acute pulmonary embolism. Normal course and caliber of the thoracic aorta. Prominently dilated main pulmonary artery (4.3 cm  diameter). Top-normal heart size. No significant pericardial fluid/thickening. Left anterior descending coronary atherosclerosis. Mediastinum/Nodes: No discrete thyroid nodules. Unremarkable esophagus. No pathologically enlarged axillary, mediastinal or hilar lymph nodes. Lungs/Pleura: No pneumothorax. No pleural effusion. No acute consolidative airspace disease, lung masses or significant pulmonary nodules. Musculoskeletal: No aggressive appearing focal osseous lesions. Mild thoracic spondylosis. Review of the MIP images confirms the above findings. CT ABDOMEN and PELVIS FINDINGS Hepatobiliary: Severe diffuse hepatic steatosis. Diffusely irregular liver surface with relative hypertrophy of the caudate lobe, compatible with hepatic cirrhosis. Patchy lace-like enhancement throughout the liver, compatible with confluent  hepatic fibrosis. No discrete liver mass. No radiopaque cholelithiasis. No definite gallbladder wall thickening. No pericholecystic fluid. No biliary ductal dilatation. Pancreas: Normal, with no mass or duct dilation. Spleen: Normal size. No mass. Adrenals/Urinary Tract: No discrete adrenal nodules. Normal kidneys with no hydronephrosis and no renal mass. Normal bladder. Stomach/Bowel: Normal non-distended stomach. Normal caliber small bowel with no small bowel wall thickening. Apparent appendectomy. Normal large bowel with no diverticulosis, large bowel wall thickening or pericolonic fat stranding. Vascular/Lymphatic: Atherosclerotic nonaneurysmal abdominal aorta. Patent portal, splenic, hepatic and renal veins. Moderate paraumbilical, small esophageal and large left retroperitoneal (connecting the SMV to the left renal vein) varices. No pathologically enlarged lymph nodes in the abdomen or pelvis. Reproductive: Normal size prostate. Other: No pneumoperitoneum, ascites or focal fluid collection. Musculoskeletal: No aggressive appearing focal osseous lesions. Review of the MIP images confirms the above findings. IMPRESSION: 1. No evidence of pulmonary embolism. 2. Prominently dilated main pulmonary artery, suggesting pulmonary arterial hypertension. 3. Severe diffuse hepatic steatosis. Patchy lace-like enhancement throughout the liver, compatible with confluent hepatic fibrosis. Prominent morphologic changes of hepatic cirrhosis. No discrete liver mass. Suggest MRI abdomen without and with IV contrast for liver screening in 3-6 months. 4. Moderate paraumbilical, small esophageal and large left retroperitoneal varices, indicative of portal hypertension. Normal size spleen. No ascites. 5. No evidence of bowel obstruction or acute bowel inflammation. 6. One vessel coronary atherosclerosis. 7. Aortic Atherosclerosis (ICD10-I70.0). Electronically Signed   By: Delbert Phenix M.D.   On: 03/29/2019 12:49   Dg Chest Port 1  View  Result Date: 03/29/2019 CLINICAL DATA:  Cough and shortness of breath EXAM: PORTABLE CHEST 1 VIEW COMPARISON:  January 15, 2019 FINDINGS: There is no edema or consolidation. Heart is mildly enlarged with pulmonary vascularity grossly normal. There is fullness in each hilar region, particularly on the right. No other findings suggesting potential adenopathy. No bone lesions. IMPRESSION: No edema or consolidation.  Heart mildly enlarged. Prominence in the hilar regions may be due to prominent central pulmonary vessels. The possibility of hilar adenopathy, particular on the right, must be of concern. This area on the right appears more prominent than on prior study. Given this concern for potential adenopathy, correlation with contrast enhanced chest CT to further evaluate is felt to be warranted. No other findings suggesting potential adenopathy. Electronically Signed   By: Bretta Bang III M.D.   On: 03/29/2019 08:49   US Abdomen Limited Ruq  Result Date: 03/29/2019 CLINICAL DATA:  Right upper quadrant pain. History of alcoholic cirrhosis. EXAM: ULTRASOUND ABDOMEN LIMITED RIGHT UPPER QUADRANT COMPARISON:  None. FINDINGS: Gallbladder: Gallstones measuring up to 1.2 cm. Gallbladder sludge. No gallbladder wall thickening. Negative sonographic Murphy's sign. Common bile duct: Diameter: 3 mm. Liver: Diffusely increased echogenicity. No focal abnormality. Portal vein not visualized. Sonographer reports technical limitations of the exam secondary to body habitus. Other: None. IMPRESSION: Gallstones and gallbladder sludge. Echogenic liver compatible with hepatic  steatosis Electronically Signed   By: Signa Kell M.D.   On: 03/29/2019 11:11    Procedures .Critical Care Performed by: Laurence Spates, MD Authorized by: Laurence Spates, MD   Critical care provider statement:    Critical care time (minutes):  30   Critical care time was exclusive of:  Separately billable procedures and  treating other patients   Critical care was necessary to treat or prevent imminent or life-threatening deterioration of the following conditions:  [place]oxid[place]   Critical care was time spent personally by me on the following activities:  Development of treatment plan with patient or surrogate, evaluation of patient's response to treatment, examination of patient, obtaining history from patient or surrogate, ordering and performing treatments and interventions, ordering and review of radiographic studies, ordering and review of laboratory studies and re-evaluation of patient's condition   (including critical care time)  Medications Ordered in ED Medications  LORazepam (ATIVAN) injection 0-4 mg (2 mg Intravenous Given 03/29/19 0759)    Or  LORazepam (ATIVAN) tablet 0-4 mg ( Oral See Alternative 03/29/19 0759)  LORazepam (ATIVAN) injection 0-4 mg (has no administration in time range)    Or  LORazepam (ATIVAN) tablet 0-4 mg (has no administration in time range)  thiamine (VITAMIN B-1) tablet 100 mg ( Oral See Alternative 03/29/19 0800)    Or  thiamine (B-1) injection 100 mg (100 mg Intravenous Given 03/29/19 0800)  sodium chloride 0.9 % bolus 1,000 mL (0 mLs Intravenous Stopped 03/29/19 0920)  fentaNYL (SUBLIMAZE) injection 100 mcg (100 mcg Intravenous Given 03/29/19 0757)  LORazepam (ATIVAN) injection 1 mg (1 mg Intravenous Given 03/29/19 0923)  fentaNYL (SUBLIMAZE) injection 100 mcg (100 mcg Intravenous Given 03/29/19 1104)  iohexol (OMNIPAQUE) 350 MG/ML injection 100 mL (100 mLs Intravenous Contrast Given 03/29/19 1139)  LORazepam (ATIVAN) injection 2 mg (2 mg Intravenous Given 03/29/19 1308)  morphine 4 MG/ML injection 4 mg (4 mg Intravenous Given 03/29/19 1328)     Initial Impression / Assessment and Plan / ED Course  I have reviewed the triage vital signs and the nursing notes.  Pertinent labs & imaging results that were available during my care of the patient were reviewed by me and  considered in my medical decision making (see chart for details).        Patient was tachycardic and tremulous on exam but O2 saturation 100% on room air and afebrile.  Suspect acute alcohol withdrawal.  Differential otherwise broad and includes volume overload from known cirrhosis, PE, COVID-19 infection, SBP, cholecystitis. Placed on CIWA protocol w/ ativan and thiamine, gave fluid bolus and fentanyl for pain.  Lab work shows initial lactate of 2.5 that corrected with fluid bolus, blood alcohol level 92, normal creatinine, AST 188, ALT 90, total bilirubin 2.4. WBC 9 normal, platelets 61,000. INR 1.3. Obtain right upper quadrant ultrasound because of his pain in this area. Ultrasound shows cholelithiasis without evidence of cholecystitis. Chest x-ray with nonspecific abnormality, recommendation of CT scan. Obtain CTA of chest as well as CT of abdomen and pelvis. Imaging shows no evidence of PE; pulmonary artery hypertension; hepatic fibrosis; multiple areas of varices suggesting portal hypertension; no ascites. He has required several doses of Ativan for ongoing withdrawal symptoms, remains alert and oriented x3. I recommended admission given ongoing symptoms as well as history of withdrawal seizure. Patient will require stabilization of his alcohol withdrawal prior to consulting general surgery regarding his cholelithiasis. No acute indication for surgery or GI involvement. COVID-19 test pending.  Discussed admission  with [facility], Dr. Ashley Royalty, I appreciate her assistance. Patient will be transferred there for further treatment.  Dale English was evaluated in Emergency Department on 03/29/2019 for the symptoms described in the history of present illness. He was evaluated in the context of the global COVID-19 pandemic, which necessitated consideration that the patient might be at risk for infection with the SARS-CoV-2 virus that causes COVID-19. Institutional protocols and  algorithms that pertain to the evaluation of patients at risk for COVID-19 are in a state of rapid change based on information released by regulatory bodies including the CDC and federal and state organizations. These policies and algorithms were followed during the patient's care in the ED.  Final Clinical Impressions(s) / ED Diagnoses   Final diagnoses:  Alcohol withdrawal syndrome without complication [facility])  Symptomatic cholelithiasis    ED Discharge Orders    None       Jillyn Stacey, Ambrose Finland, MD 03/29/19 1332

## 2019-03-29 NOTE — Plan of Care (Signed)
43 year old male admitted from [place] with alcohol withdrawal.  His Covid is pending.  Patient has history of cirrhosis recently moved from [place] was found to be tachycardic with tremors complaining of nausea vomiting and abdominal pain and shortness of breath.  CT angiogram was done as he was tachycardic and complains of shortness of breath CT angiogram negative for pulmonary embolism.  He has history of alcohol withdrawal seizures.  He was also found to have gallstones.  He is being admitted for alcohol withdrawal gallstones telemetry inpatient.

## 2019-03-29 NOTE — ED Notes (Signed)
Patient transported to CT 

## 2019-03-30 ENCOUNTER — Inpatient Hospital Stay (HOSPITAL_COMMUNITY): Payer: Self-pay

## 2019-03-30 DIAGNOSIS — K8020 Calculus of gallbladder without cholecystitis without obstruction: Secondary | ICD-10-CM | POA: Diagnosis present

## 2019-03-30 DIAGNOSIS — E876 Hypokalemia: Secondary | ICD-10-CM | POA: Diagnosis present

## 2019-03-30 DIAGNOSIS — R7989 Other specified abnormal findings of blood chemistry: Secondary | ICD-10-CM | POA: Diagnosis present

## 2019-03-30 DIAGNOSIS — I361 Nonrheumatic tricuspid (valve) insufficiency: Secondary | ICD-10-CM

## 2019-03-30 DIAGNOSIS — K7030 Alcoholic cirrhosis of liver without ascites: Secondary | ICD-10-CM | POA: Diagnosis present

## 2019-03-30 DIAGNOSIS — K766 Portal hypertension: Secondary | ICD-10-CM | POA: Diagnosis present

## 2019-03-30 DIAGNOSIS — D696 Thrombocytopenia, unspecified: Secondary | ICD-10-CM | POA: Diagnosis present

## 2019-03-30 LAB — CBC
HCT: 46.1 % (ref 39.0–52.0)
Hemoglobin: 15.5 g/dL (ref 13.0–17.0)
MCH: 32.8 pg (ref 26.0–34.0)
MCHC: 33.6 g/dL (ref 30.0–36.0)
MCV: 97.5 fL (ref 80.0–100.0)
Platelets: 57 10*3/uL — ABNORMAL LOW (ref 150–400)
RBC: 4.73 MIL/uL (ref 4.22–5.81)
RDW: 13.9 % (ref 11.5–15.5)
WBC: 4.8 10*3/uL (ref 4.0–10.5)
nRBC: 0 % (ref 0.0–0.2)

## 2019-03-30 LAB — COMPREHENSIVE METABOLIC PANEL
ALT: 76 U/L — ABNORMAL HIGH (ref 0–44)
AST: 126 U/L — ABNORMAL HIGH (ref 15–41)
Albumin: 3.4 g/dL — ABNORMAL LOW (ref 3.5–5.0)
Alkaline Phosphatase: 103 U/L (ref 38–126)
Anion gap: 9 (ref 5–15)
BUN: 7 mg/dL (ref 6–20)
CO2: 24 mmol/L (ref 22–32)
Calcium: 8.6 mg/dL — ABNORMAL LOW (ref 8.9–10.3)
Chloride: 105 mmol/L (ref 98–111)
Creatinine, Ser: 0.59 mg/dL — ABNORMAL LOW (ref 0.61–1.24)
GFR calc Af Amer: >60 mL/min (ref >60)
GFR calc non Af Amer: >60 mL/min (ref >60)
Glucose, Bld: 94 mg/dL (ref 70–99)
Potassium: 3.2 mmol/L — ABNORMAL LOW (ref 3.5–5.1)
Sodium: 138 mmol/L (ref 135–145)
Total Bilirubin: 3.1 mg/dL — ABNORMAL HIGH (ref 0.3–1.2)
Total Protein: 6.9 g/dL (ref 6.5–8.1)

## 2019-03-30 LAB — ECHOCARDIOGRAM COMPLETE
Height: 70 in
Weight: 5616 oz

## 2019-03-30 LAB — MAGNESIUM: Magnesium: 1.6 mg/dL — ABNORMAL LOW (ref 1.7–2.4)

## 2019-03-30 LAB — TSH: TSH: 1.345 u[IU]/mL (ref 0.350–4.500)

## 2019-03-30 LAB — LIPID PANEL
Cholesterol: 94 mg/dL (ref 0–200)
HDL: 26 mg/dL — ABNORMAL LOW (ref >40)
LDL Cholesterol: 58 mg/dL (ref 0–99)
Total CHOL/HDL Ratio: 3.6 RATIO
Triglycerides: 48 mg/dL (ref <150)
VLDL: 10 mg/dL (ref 0–40)

## 2019-03-30 LAB — PROTIME-INR
INR: 1.3 — ABNORMAL HIGH (ref 0.8–1.2)
Prothrombin Time: 16 seconds — ABNORMAL HIGH (ref 11.4–15.2)

## 2019-03-30 LAB — PHOSPHORUS: Phosphorus: 3.1 mg/dL (ref 2.5–4.6)

## 2019-03-30 MED ORDER — MAGNESIUM SULFATE 2 GM/50ML IV SOLN
2.0000 g | Freq: Once | INTRAVENOUS | Status: AC
  Administered 2019-03-30: 2 g via INTRAVENOUS
  Filled 2019-03-30: qty 50

## 2019-03-30 MED ORDER — LORAZEPAM 1 MG PO TABS
0.0000 mg | ORAL_TABLET | Freq: Four times a day (QID) | ORAL | Status: AC
  Administered 2019-03-30: 2 mg via ORAL
  Filled 2019-03-30: qty 2

## 2019-03-30 MED ORDER — MORPHINE SULFATE (PF) 2 MG/ML IV SOLN
2.0000 mg | INTRAVENOUS | Status: DC | PRN
  Administered 2019-03-30 (×2): 2 mg via INTRAVENOUS
  Filled 2019-03-30 (×2): qty 1

## 2019-03-30 MED ORDER — NICOTINE 14 MG/24HR TD PT24
14.0000 mg | MEDICATED_PATCH | Freq: Every day | TRANSDERMAL | Status: DC
  Administered 2019-03-30: 14 mg via TRANSDERMAL
  Filled 2019-03-30: qty 1

## 2019-03-30 MED ORDER — LACTULOSE 10 GM/15ML PO SOLN
30.0000 g | Freq: Two times a day (BID) | ORAL | Status: DC
  Administered 2019-03-30 – 2019-03-31 (×4): 30 g via ORAL
  Filled 2019-03-30 (×4): qty 60

## 2019-03-30 MED ORDER — LORAZEPAM 2 MG/ML IJ SOLN: 0.0000 mg | Freq: Four times a day (QID) | INTRAMUSCULAR | Status: AC

## 2019-03-30 MED ORDER — POTASSIUM CHLORIDE 10 MEQ/100ML IV SOLN
10.0000 meq | INTRAVENOUS | Status: AC
  Administered 2019-03-30 (×2): 10 meq via INTRAVENOUS
  Filled 2019-03-30 (×2): qty 100

## 2019-03-30 MED ORDER — OXYCODONE HCL 5 MG PO TABS
5.0000 mg | ORAL_TABLET | Freq: Four times a day (QID) | ORAL | Status: DC | PRN
  Administered 2019-03-31: 10 mg via ORAL
  Filled 2019-03-30: qty 2

## 2019-03-30 MED ORDER — SODIUM CHLORIDE 0.9 % IV SOLN
INTRAVENOUS | Status: AC
  Administered 2019-03-30: 11:00:00 via INTRAVENOUS

## 2019-03-30 MED ORDER — HYDROXYZINE HCL 25 MG PO TABS
25.0000 mg | ORAL_TABLET | ORAL | Status: DC | PRN
  Administered 2019-03-30 – 2019-03-31 (×2): 25 mg via ORAL
  Filled 2019-03-30 (×2): qty 1

## 2019-03-30 MED ORDER — POTASSIUM CHLORIDE CRYS ER 20 MEQ PO TBCR
40.0000 meq | EXTENDED_RELEASE_TABLET | Freq: Two times a day (BID) | ORAL | Status: AC
  Administered 2019-03-30 (×2): 40 meq via ORAL
  Filled 2019-03-30 (×2): qty 2

## 2019-03-30 NOTE — Progress Notes (Signed)
  Echocardiogram 2D Echocardiogram has been performed.  Dale English 03/30/2019, 10:40 AM

## 2019-03-30 NOTE — Progress Notes (Addendum)
PROGRESS NOTE    Dale English  IHK:[id number] DOB: [birth year] DOA: 03/29/2019 PCP: Patient, No Pcp Per   Brief Narrative:  HPI per Dr. Toy Baker on 03/29/2019 Dale English is a 43 y.o. male with medical history significant of EtOh abuse, cirrhosis    Presented with  Shortness of breath and Right side abd pain For 1 wk With n/V unable to keep down PO Feeling shaky and his heart is racing, reports abdominal swelling Prior hx of ETOH withdrawal seizures  also endorse 1wk of cough and runny nose No fever or chills  Patient reports years ago he was diagnosed with cirrhosis secondary to alcohol abuse He was in severe liver failure with ascites requiring repeated draining. Patient was able to be sober for quite some time and improved stabilizing Up until few months ago then they recurred He has been drinking heavily up until a week ago when he realized that he needs to stop He did feel tremulous and had couple beers couple days ago to try to help his symptoms Prior to this he did was a heavy drinker since teenage years up until previous attempt at stopping  Patient has continuous tobacco abuse also interested in quitting at that time He reports occasional bleeding when he brushes his teeth No rashes No leg swelling  Infectious risk factors:  Reports shortness of breath, URI symptoms  In  ER RAPID COVID TEST NEGATIVE   Recent Labs       Lab Results  Component Value Date   [place] NEGATIVE 03/29/2019      Regarding pertinent Chronic problems:     etoh abuse with hx of DT's  While in ER:  Initially tachycardic up to 122 Elevated LFT's EtOH 92 Lactic acid 2.5 plt 61 INR 1.3 Ultrasound shows cholelithiasis without evidence of cholecystitis  **Interim History Patient is still actively withdrawing and complaining of pain.  Medication adjustments have been made and a nicotine patch was given to the patient.  We will need to monitor patient's  clinical response to intervention  Assessment & Plan:   Active Problems:   Alcohol withdrawal (HCC)   Hypokalemia   Alcoholic cirrhosis of liver without ascites (HCC)   Cholelithiases   Increased ammonia level   Thrombocytopenia (HCC)   Portal hypertension (HCC)  Alcohol Withdrawal  -States he drinks heavily and has last drink was 2 days ago. -Started on CIWA protocol with lorazepam and continue folic acid, multivitamin and thiamine -Also added hydroxyzine 25 mg p.o. every 4 as needed for anxiety -Continue to monitor for signs and symptoms of withdrawal -Social work consulted for resources  Tobacco Abuse -Smoking cessation counseling given -Initially did not want a nicotine patch but now requires 1 -We will start nicotine 14 mg transdermally  Alcoholic liver cirrhosis without ascites and portal hypertension -Mild decompensation -Currently Maddrey Discriminant Score is 21.5 and has a good Prognosis and will not need glucocorticoid therapy currently as his scores were about 32 -Started lactulose for hyperammonemia and will need to continue monitor LFTs, T bili as well as PT and INR -Obtained a right upper quadrant ultrasound and showed "Gallstones and gallbladder sludge. Echogenic liver compatible with hepatic steatosis."  -Check an Acute Hepatitis Panel -May benefit from a GI consult if he is not improving or worsening -Pain control of his abdominal pain with oxycodone and morphine  Abnormal LFTs -In the setting of alcohol withdrawal and alcoholism along with liver cirrhosis -Patient's AST and ALT are trending down his AST went from 1  88 to 126 and ALT went from 90 and is now 76 -Continue to monitor and trend LFTs and will right upper quadrant ultrasound  As above -Check an acute hepatitis panel -Repeat CMP in a.m.  Thrombocytopeina  -In the setting of his liver cirrhosis and splenic sequestration -Platelet count went from 61,000 and is now 57,000 -Continue to monitor for  signs and symptoms of bleeding; currently no overt bleeding noted -Repeat CBC in a.m.  HyperAmmonemia  -Patient's ammonia level was 86 on admission -He was started on lactulose 30 g p.o. twice daily -Continue monitor and trend and repeat ammonia level in a.m.  Hyperbilirubinemia -T Bili went from 2.4 is now 3.1 -In setting of decompensated liver cirrhosis -Continue to monitor and trend and repeat CMP in a.m. -He may need a right upper quadrant ultrasound as well  Super Morbid Obesity -Estimated body mass index is 50.36 kg/m as calculated from the following:   Height as of this encounter:  (1.778 m).   Weight as of this encounter: 159.2 kg. -Weight Loss and Dietary Counseling given -Nutritionist Consulted for for further evaluation and recommendations   Cholelithiasis -? Causing RUQ Pain -Will need Gen Surgery evaluation and will discuss with them in AM -Pain Control as above  Electrolyte Abnormalities:   Hypokalemia -Patient's K+ this AM was 3.2 -Replete with po KCl 40 mEQ BID x2 Doses -Mag Level was 1.6 and being replete as below -Continue to Monitor and Replete as Necessary   Hypopmagnesemia -Patient's Mag Level this AM was 1.6 -Replete with IV Mag Sulfate 2 grams -Continue to Monitor and Replete as Necessary -Repeat Phos Level in AM     DVT prophylaxis: SCDs given his thrombocytopenia Code Status: FULL CODE  Family Communication: No family present at bedside  Disposition Plan: Pending clinical improvement and improvement of withdrawal symptoms  Consultants:   None  Procedures: None   Antimicrobials:  Anti-infectives (From admission, onward)   None     Subjective: Seen and examined at bedside and was still actively withdrawing and had some tremors.  Also complaining of some abdominal pain.  No nausea or vomiting.  No other concerns or complaints at this time and is now requesting a nicotine patch  Objective: Vitals:   03/29/19 1931 03/29/19 2115  03/30/19 0507 03/30/19 0509  BP: (!) 142/92 134/85 124/71 124/71  Pulse: 92 97 85 83  Resp:  18  18  Temp:  98.5 F (36.9 C)  98 F (36.7 C)  TempSrc:  Oral  Oral  SpO2:  99%  97%  Weight:      Height:        Intake/Output Summary (Last 24 hours) at 03/30/2019 0805 Last data filed at 03/30/2019 0221 Gross per 24 hour  Intake 1000 ml  Output 200 ml  Net 800 ml   Filed Weights   03/29/19 0724  Weight: (!) 159.2 kg   Examination: Physical Exam:  Constitutional: WN/WD super morbidly obese Caucasian male who appears anxious and uncomfortable  Eyes: Lids and conjunctivae normal, sclerae mildly icteric  ENMT: External Ears, Nose appear normal. Grossly normal hearing. Mucous membranes are moist.  Neck: Appears normal, supple, no cervical masses, normal ROM, no appreciable thyromegaly; Difficult to assess JVD Respiratory: Diminished to auscultation bilaterally, no wheezing, rales, rhonchi or crackles. Normal respiratory effort and patient is not tachypenic. No accessory muscle use. Unlabored breathing  Cardiovascular: Tachycardic Rate but regular rhythm, no murmurs / rubs / gallops. S1 and S2 auscultated. 1+ extremity edema. Marland Kitchen  Abdomen: Soft, Tender to palpate, Distended due to body habitus.  Bowel sounds positive x4.  GU: Deferred. Musculoskeletal: No clubbing / cyanosis of digits/nails. No joint deformity upper and lower extremities. Skin: No rashes, lesions, ulcers on a limited skin evaluation. No induration; Warm and dry.  Neurologic: CN 2-12 grossly intact with no focal deficits. Romberg sign and cerebellar reflexes not assessed.  Psychiatric: Normal judgment and insight. Alert and oriented x 3. Anxious mood and appropriate affect.   Data Reviewed: I have personally reviewed following labs and imaging studies  CBC: Recent Labs  Lab 03/29/19 0734 03/30/19 0419  WBC 5.6 4.8  NEUTROABS 3.0  --   HGB 17.3* 15.5  HCT 50.6 46.1  MCV 95.7 97.5  PLT 61* 57*   Basic Metabolic  Panel: Recent Labs  Lab 03/29/19 0734 03/30/19 0419  NA 140 138  K 3.2* 3.2*  CL 105 105  CO2 22 24  GLUCOSE 107* 94  BUN 6 7  CREATININE 0.70 0.59*  CALCIUM 9.4 8.6*  MG  --  1.6*  PHOS  --  3.1   GFR: Estimated Creatinine Clearance: 182.9 mL/min (A) (by C-G formula based on SCr of 0.59 mg/dL (L)). Liver Function Tests: Recent Labs  Lab 03/29/19 0734 03/30/19 0419  AST 188* 126*  ALT 90* 76*  ALKPHOS 148* 103  BILITOT 2.4* 3.1*  PROT 7.6 6.9  ALBUMIN 3.8 3.4*   No results for input(s): LIPASE, AMYLASE in the last 168 hours. Recent Labs  Lab 03/29/19 2157  AMMONIA 86*   Coagulation Profile: Recent Labs  Lab 03/29/19 0734 03/30/19 0419  INR 1.3* 1.3*   Cardiac Enzymes: No results for input(s): CKTOTAL, CKMB, CKMBINDEX, TROPONINI in the last 168 hours. BNP (last 3 results) No results for input(s): PROBNP in the last 8760 hours. HbA1C: No results for input(s): HGBA1C in the last 72 hours. CBG: No results for input(s): GLUCAP in the last 168 hours. Lipid Profile: Recent Labs    03/30/19 0419  CHOL 94  HDL 26*  LDLCALC 58  TRIG 48  CHOLHDL 3.6   Thyroid Function Tests: Recent Labs    03/30/19 0419  TSH 1.345   Anemia Panel: No results for input(s): VITAMINB12, FOLATE, FERRITIN, TIBC, IRON, RETICCTPCT in the last 72 hours. Sepsis Labs: Recent Labs  Lab 03/29/19 0745 03/29/19 0939  LATICACIDVEN 2.5* 1.8    Recent Results (from the past 240 hour(s))  SARS CORONAVIRUS 2 (TAT 6-24 HRS) Nasopharyngeal Nasopharyngeal Swab     Status: None   Collection Time: 03/29/19  8:02 AM   Specimen: Nasopharyngeal Swab  Result Value Ref Range Status   SARS Coronavirus 2 NEGATIVE NEGATIVE Final    Comment: (NOTE) SARS-CoV-2 target nucleic acids are NOT DETECTED. The SARS-CoV-2 RNA is generally detectable in upper and lower respiratory specimens during the acute phase of infection. Negative results do not preclude SARS-CoV-2 infection, do not rule  out co-infections with other pathogens, and should not be used as the sole basis for treatment or other patient management decisions. Negative results must be combined with clinical observations, patient history, and epidemiological information. The expected result is Negative. Fact Sheet for Patients: HairSlick.no Fact Sheet for Healthcare Providers: quierodirigir.com This test is not yet approved or cleared by the [place] FDA and  has been authorized for detection and/or diagnosis of SARS-CoV-2 by FDA under an Emergency Use Authorization (EUA). This EUA will remain  in effect (meaning this test can be used) for the duration of the COVID-19 declaration  under Section 56 4(b)(1) of the Act, 21 U.S.C. section 360bbb-3(b)(1), unless the authorization is terminated or revoked sooner. Performed at [facility], [address] [place], [place] [address]     Radiology Studies: Ct Angio Chest Pe W/cm &/or Wo Cm  Result Date: 03/29/2019 CLINICAL DATA:  Dyspnea. Hilar prominence on chest radiograph. Right upper quadrant abdominal pain, nausea and vomiting. History of cirrhosis. EXAM: CT ANGIOGRAPHY CHEST CT ABDOMEN AND PELVIS WITH CONTRAST TECHNIQUE: Multidetector CT imaging of the chest was performed using the standard protocol during bolus administration of intravenous contrast. Multiplanar CT image reconstructions and MIPs were obtained to evaluate the vascular anatomy. Multidetector CT imaging of the abdomen and pelvis was performed using the standard protocol during bolus administration of intravenous contrast. CONTRAST:  100mL OMNIPAQUE IOHEXOL 350 MG/ML SOLN COMPARISON:  Chest radiograph and right upper quadrant abdominal sonogram from earlier today. FINDINGS: CTA CHEST FINDINGS Cardiovascular: The study is low-to-moderate quality for the evaluation of pulmonary embolism, degraded by motion and suboptimal contrast opacification.  There are no convincing filling defects in the central, lobar, segmental or subsegmental pulmonary artery branches to suggest acute pulmonary embolism. Normal course and caliber of the thoracic aorta. Prominently dilated main pulmonary artery (4.3 cm diameter). Top-normal heart size. No significant pericardial fluid/thickening. Left anterior descending coronary atherosclerosis. Mediastinum/Nodes: No discrete thyroid nodules. Unremarkable esophagus. No pathologically enlarged axillary, mediastinal or hilar lymph nodes. Lungs/Pleura: No pneumothorax. No pleural effusion. No acute consolidative airspace disease, lung masses or significant pulmonary nodules. Musculoskeletal: No aggressive appearing focal osseous lesions. Mild thoracic spondylosis. Review of the MIP images confirms the above findings. CT ABDOMEN and PELVIS FINDINGS Hepatobiliary: Severe diffuse hepatic steatosis. Diffusely irregular liver surface with relative hypertrophy of the caudate lobe, compatible with hepatic cirrhosis. Patchy lace-like enhancement throughout the liver, compatible with confluent hepatic fibrosis. No discrete liver mass. No radiopaque cholelithiasis. No definite gallbladder wall thickening. No pericholecystic fluid. No biliary ductal dilatation. Pancreas: Normal, with no mass or duct dilation. Spleen: Normal size. No mass. Adrenals/Urinary Tract: No discrete adrenal nodules. Normal kidneys with no hydronephrosis and no renal mass. Normal bladder. Stomach/Bowel: Normal non-distended stomach. Normal caliber small bowel with no small bowel wall thickening. Apparent appendectomy. Normal large bowel with no diverticulosis, large bowel wall thickening or pericolonic fat stranding. Vascular/Lymphatic: Atherosclerotic nonaneurysmal abdominal aorta. Patent portal, splenic, hepatic and renal veins. Moderate paraumbilical, small esophageal and large left retroperitoneal (connecting the SMV to the left renal vein) varices. No pathologically  enlarged lymph nodes in the abdomen or pelvis. Reproductive: Normal size prostate. Other: No pneumoperitoneum, ascites or focal fluid collection. Musculoskeletal: No aggressive appearing focal osseous lesions. Review of the MIP images confirms the above findings. IMPRESSION: 1. No evidence of pulmonary embolism. 2. Prominently dilated main pulmonary artery, suggesting pulmonary arterial hypertension. 3. Severe diffuse hepatic steatosis. Patchy lace-like enhancement throughout the liver, compatible with confluent hepatic fibrosis. Prominent morphologic changes of hepatic cirrhosis. No discrete liver mass. Suggest MRI abdomen without and with IV contrast for liver screening in 3-6 months. 4. Moderate paraumbilical, small esophageal and large left retroperitoneal varices, indicative of portal hypertension. Normal size spleen. No ascites. 5. No evidence of bowel obstruction or acute bowel inflammation. 6. One vessel coronary atherosclerosis. 7. Aortic Atherosclerosis (ICD10-I70.0). Electronically Signed   By: Delbert PhenixJason A Poff M.D.   On: 03/29/2019 12:49   Ct Abdomen Pelvis W Contrast  Result Date: 03/29/2019 CLINICAL DATA:  Dyspnea. Hilar prominence on chest radiograph. Right upper quadrant abdominal pain, nausea and vomiting. History of  cirrhosis. EXAM: CT ANGIOGRAPHY CHEST CT ABDOMEN AND PELVIS WITH CONTRAST TECHNIQUE: Multidetector CT imaging of the chest was performed using the standard protocol during bolus administration of intravenous contrast. Multiplanar CT image reconstructions and MIPs were obtained to evaluate the vascular anatomy. Multidetector CT imaging of the abdomen and pelvis was performed using the standard protocol during bolus administration of intravenous contrast. CONTRAST:  OMNIPAQUE IOHEXOL 350 MG/ML SOLN COMPARISON:  Chest radiograph and right upper quadrant abdominal sonogram from earlier today. FINDINGS: CTA CHEST FINDINGS Cardiovascular: The study is low-to-moderate quality for the  evaluation of pulmonary embolism, degraded by motion and suboptimal contrast opacification. There are no convincing filling defects in the central, lobar, segmental or subsegmental pulmonary artery branches to suggest acute pulmonary embolism. Normal course and caliber of the thoracic aorta. Prominently dilated main pulmonary artery (4.3 cm diameter). Top-normal heart size. No significant pericardial fluid/thickening. Left anterior descending coronary atherosclerosis. Mediastinum/Nodes: No discrete thyroid nodules. Unremarkable esophagus. No pathologically enlarged axillary, mediastinal or hilar lymph nodes. Lungs/Pleura: No pneumothorax. No pleural effusion. No acute consolidative airspace disease, lung masses or significant pulmonary nodules. Musculoskeletal: No aggressive appearing focal osseous lesions. Mild thoracic spondylosis. Review of the MIP images confirms the above findings. CT ABDOMEN and PELVIS FINDINGS Hepatobiliary: Severe diffuse hepatic steatosis. Diffusely irregular liver surface with relative hypertrophy of the caudate lobe, compatible with hepatic cirrhosis. Patchy lace-like enhancement throughout the liver, compatible with confluent hepatic fibrosis. No discrete liver mass. No radiopaque cholelithiasis. No definite gallbladder wall thickening. No pericholecystic fluid. No biliary ductal dilatation. Pancreas: Normal, with no mass or duct dilation. Spleen: Normal size. No mass. Adrenals/Urinary Tract: No discrete adrenal nodules. Normal kidneys with no hydronephrosis and no renal mass. Normal bladder. Stomach/Bowel: Normal non-distended stomach. Normal caliber small bowel with no small bowel wall thickening. Apparent appendectomy. Normal large bowel with no diverticulosis, large bowel wall thickening or pericolonic fat stranding. Vascular/Lymphatic: Atherosclerotic nonaneurysmal abdominal aorta. Patent portal, splenic, hepatic and renal veins. Moderate paraumbilical, small esophageal and large  left retroperitoneal (connecting the SMV to the left renal vein) varices. No pathologically enlarged lymph nodes in the abdomen or pelvis. Reproductive: Normal size prostate. Other: No pneumoperitoneum, ascites or focal fluid collection. Musculoskeletal: No aggressive appearing focal osseous lesions. Review of the MIP images confirms the above findings. IMPRESSION: 1. No evidence of pulmonary embolism. 2. Prominently dilated main pulmonary artery, suggesting pulmonary arterial hypertension. 3. Severe diffuse hepatic steatosis. Patchy lace-like enhancement throughout the liver, compatible with confluent hepatic fibrosis. Prominent morphologic changes of hepatic cirrhosis. No discrete liver mass. Suggest MRI abdomen without and with IV contrast for liver screening in 3-6 months. 4. Moderate paraumbilical, small esophageal and large left retroperitoneal varices, indicative of portal hypertension. Normal size spleen. No ascites. 5. No evidence of bowel obstruction or acute bowel inflammation. 6. One vessel coronary atherosclerosis. 7. Aortic Atherosclerosis (ICD10-I70.0). Electronically Signed   By: Delbert Phenix M.D.   On: 03/29/2019 12:49   Dg Chest Port 1 View  Result Date: 03/29/2019 CLINICAL DATA:  Cough and shortness of breath EXAM: PORTABLE CHEST 1 VIEW COMPARISON:  January 15, 2019 FINDINGS: There is no edema or consolidation. Heart is mildly enlarged with pulmonary vascularity grossly normal. There is fullness in each hilar region, particularly on the right. No other findings suggesting potential adenopathy. No bone lesions. IMPRESSION: No edema or consolidation.  Heart mildly enlarged. Prominence in the hilar regions may be due to prominent central pulmonary vessels. The possibility of hilar adenopathy, particular on the right, must be of  concern. This area on the right appears more prominent than on prior study. Given this concern for potential adenopathy, correlation with contrast enhanced chest CT to  further evaluate is felt to be warranted. No other findings suggesting potential adenopathy. Electronically Signed   By: Bretta Bang III M.D.   On: 03/29/2019 08:49   US Abdomen Limited Ruq  Result Date: 03/29/2019 CLINICAL DATA:  Right upper quadrant pain. History of alcoholic cirrhosis. EXAM: ULTRASOUND ABDOMEN LIMITED RIGHT UPPER QUADRANT COMPARISON:  None. FINDINGS: Gallbladder: Gallstones measuring up to 1.2 cm. Gallbladder sludge. No gallbladder wall thickening. Negative sonographic Murphy's sign. Common bile duct: Diameter: 3 mm. Liver: Diffusely increased echogenicity. No focal abnormality. Portal vein not visualized. Sonographer reports technical limitations of the exam secondary to body habitus. Other: None. IMPRESSION: Gallstones and gallbladder sludge. Echogenic liver compatible with hepatic steatosis Electronically Signed   By: Signa Kell M.D.   On: 03/29/2019 11:11   Scheduled Meds:  lactulose  30 g Oral BID   LORazepam  0-4 mg Intravenous Q6H   Or   LORazepam  0-4 mg Oral Q6H   [START ON 03/31/2019] LORazepam  0-4 mg Intravenous Q12H   Or   [START ON 03/31/2019] LORazepam  0-4 mg Oral Q12H   potassium chloride  40 mEq Oral BID   thiamine  100 mg Oral Daily   Or   thiamine  100 mg Intravenous Daily   Continuous Infusions:  sodium chloride     magnesium sulfate bolus IVPB      LOS: 1 day   Merlene Laughter, DO [facility] PAGER is on AMION  If 7PM-7AM, please contact night-coverage [email or web address] Password [email or web address] 03/30/2019, 8:05 AM

## 2019-03-31 DIAGNOSIS — K8020 Calculus of gallbladder without cholecystitis without obstruction: Secondary | ICD-10-CM

## 2019-03-31 DIAGNOSIS — F10230 Alcohol dependence with withdrawal, uncomplicated: Principal | ICD-10-CM

## 2019-03-31 DIAGNOSIS — K766 Portal hypertension: Secondary | ICD-10-CM

## 2019-03-31 DIAGNOSIS — D696 Thrombocytopenia, unspecified: Secondary | ICD-10-CM

## 2019-03-31 DIAGNOSIS — K7030 Alcoholic cirrhosis of liver without ascites: Secondary | ICD-10-CM

## 2019-03-31 DIAGNOSIS — R7989 Other specified abnormal findings of blood chemistry: Secondary | ICD-10-CM

## 2019-03-31 DIAGNOSIS — E876 Hypokalemia: Secondary | ICD-10-CM

## 2019-03-31 LAB — COMPREHENSIVE METABOLIC PANEL WITH GFR
ALT: 63 U/L — ABNORMAL HIGH (ref 0–44)
AST: 104 U/L — ABNORMAL HIGH (ref 15–41)
Albumin: 3.1 g/dL — ABNORMAL LOW (ref 3.5–5.0)
Alkaline Phosphatase: 98 U/L (ref 38–126)
Anion gap: 6 (ref 5–15)
BUN: 6 mg/dL (ref 6–20)
CO2: 26 mmol/L (ref 22–32)
Calcium: 8.3 mg/dL — ABNORMAL LOW (ref 8.9–10.3)
Chloride: 107 mmol/L (ref 98–111)
Creatinine, Ser: 0.63 mg/dL (ref 0.61–1.24)
GFR calc Af Amer: >60 mL/min
GFR calc non Af Amer: >60 mL/min
Glucose, Bld: 91 mg/dL (ref 70–99)
Potassium: 3.4 mmol/L — ABNORMAL LOW (ref 3.5–5.1)
Sodium: 139 mmol/L (ref 135–145)
Total Bilirubin: 2.3 mg/dL — ABNORMAL HIGH (ref 0.3–1.2)
Total Protein: 6.3 g/dL — ABNORMAL LOW (ref 6.5–8.1)

## 2019-03-31 LAB — HEPATITIS PANEL, ACUTE
HCV Ab: NONREACTIVE
Hep A IgM: NONREACTIVE
Hep B C IgM: NONREACTIVE
Hepatitis B Surface Ag: NONREACTIVE

## 2019-03-31 LAB — CBC WITH DIFFERENTIAL/PLATELET
Abs Immature Granulocytes: 0.01 10*3/uL (ref 0.00–0.07)
Basophils Absolute: 0 10*3/uL (ref 0.0–0.1)
Basophils Relative: 1 %
Eosinophils Absolute: 0.2 10*3/uL (ref 0.0–0.5)
Eosinophils Relative: 5 %
HCT: 46.5 % (ref 39.0–52.0)
Hemoglobin: 15.1 g/dL (ref 13.0–17.0)
Immature Granulocytes: 0 %
Lymphocytes Relative: 16 %
Lymphs Abs: 0.6 10*3/uL — ABNORMAL LOW (ref 0.7–4.0)
MCH: 33 pg (ref 26.0–34.0)
MCHC: 32.5 g/dL (ref 30.0–36.0)
MCV: 101.5 fL — ABNORMAL HIGH (ref 80.0–100.0)
Monocytes Absolute: 0.6 10*3/uL (ref 0.1–1.0)
Monocytes Relative: 15 %
Neutro Abs: 2.4 10*3/uL (ref 1.7–7.7)
Neutrophils Relative %: 63 %
Platelets: 47 10*3/uL — ABNORMAL LOW (ref 150–400)
RBC: 4.58 MIL/uL (ref 4.22–5.81)
RDW: 13.8 % (ref 11.5–15.5)
WBC: 3.8 10*3/uL — ABNORMAL LOW (ref 4.0–10.5)
nRBC: 0 % (ref 0.0–0.2)

## 2019-03-31 LAB — PROTIME-INR
INR: 1.3 — ABNORMAL HIGH (ref 0.8–1.2)
Prothrombin Time: 16.1 seconds — ABNORMAL HIGH (ref 11.4–15.2)

## 2019-03-31 LAB — MAGNESIUM: Magnesium: 1.7 mg/dL (ref 1.7–2.4)

## 2019-03-31 LAB — AMMONIA: Ammonia: 41 umol/L — ABNORMAL HIGH (ref 9–35)

## 2019-03-31 LAB — PHOSPHORUS: Phosphorus: 2.9 mg/dL (ref 2.5–4.6)

## 2019-03-31 MED ORDER — LACTULOSE 10 GM/15ML PO SOLN: 30.0000 g | Freq: Two times a day (BID) | ORAL | 0 refills | Status: AC

## 2019-03-31 NOTE — Discharge Summary (Signed)
Physician Discharge Summary  Dale English ZOX:[id number] DOB: [birth year] DOA: 03/29/2019  PCP: Patient, No Pcp Per  Admit date: 03/29/2019 Discharge date: 03/31/2019  Admitted From: Inpatient Disposition: home  Recommendations for Outpatient Follow-up:  1. Follow up with PCP in 1-2 weeks   Home Health:No Equipment/Devices:none  Discharge Condition:Stable CODE STATUS:Full code Diet recommendation: Regular healthy diet  Brief/Interim Summary: This is a 43 year old white male with a past medical history to include alcohol abuse, tobacco dependence, and cirrhosis who presented with 1 week history of shortness of breath and right-sided abdominal pain.  He reported nausea vomiting inability to keep p.o. intake down.  Patient reported he had a 6-year history of alcohol abstinence but "someone on a bender" he presented to the ER with symptoms as above and was subsequently admitted by the hospital service for further eval and treatment for alcohol withdrawal.  Hospital course: Alcohol withdrawal.  Patient was placed on CIWA protocol rapidly improved.  His electrolyte and LFT abnormalities improved.  Patient's p.o. intake improved to baseline he was tolerating diet without complication.  Upon seeing the patient for the first time today did request to be discharged home with felt he was at his baseline.  The patient received alcohol cessation counseling.  He was stable hemodynamically for discharge.  Discharge Diagnoses:  Active Problems:   Alcohol withdrawal (HCC)   Hypokalemia   Alcoholic cirrhosis of liver without ascites (HCC)   Cholelithiases   Increased ammonia level   Thrombocytopenia (HCC)   Portal hypertension (HCC)    Discharge Instructions  Discharge Instructions    Call MD for:   Complete by: As directed    For any acute change   Call MD for:  difficulty breathing, headache or visual disturbances   Complete by: As directed    Call MD for:  extreme fatigue   Complete by:  As directed    Call MD for:  persistant dizziness or light-headedness   Complete by: As directed    Call MD for:  persistant nausea and vomiting   Complete by: As directed    Call MD for:  severe uncontrolled pain   Complete by: As directed    Call MD for:  temperature >100.4   Complete by: As directed    Diet - low sodium heart healthy   Complete by: As directed    Increase activity slowly   Complete by: As directed      Allergies as of 03/31/2019   No Known Allergies     Medication List    TAKE these medications   acetaminophen 500 MG tablet Commonly known as: TYLENOL Take 1,000 mg by mouth every 6 (six) hours as needed for moderate pain.   doxylamine (Sleep) 25 MG tablet Commonly known as: UNISOM Take 50 mg by mouth at bedtime as needed for sleep.   Flintstones Complete 18 MG Chew Chew 2 tablets by mouth daily.   lactulose 10 GM/15ML solution Commonly known as: CHRONULAC Take 45 mLs (30 g total) by mouth 2 (two) times daily.       No Known Allergies  Consultations:  None   Procedures/Studies: Ct Angio Chest Pe W/cm &/or Wo Cm  Result Date: 03/29/2019 CLINICAL DATA:  Dyspnea. Hilar prominence on chest radiograph. Right upper quadrant abdominal pain, nausea and vomiting. History of cirrhosis. EXAM: CT ANGIOGRAPHY CHEST CT ABDOMEN AND PELVIS WITH CONTRAST TECHNIQUE: Multidetector CT imaging of the chest was performed using the standard protocol during bolus administration of intravenous contrast. Multiplanar CT image reconstructions  and MIPs were obtained to evaluate the vascular anatomy. Multidetector CT imaging of the abdomen and pelvis was performed using the standard protocol during bolus administration of intravenous contrast. CONTRAST:  OMNIPAQUE IOHEXOL 350 MG/ML SOLN COMPARISON:  Chest radiograph and right upper quadrant abdominal sonogram from earlier today. FINDINGS: CTA CHEST FINDINGS Cardiovascular: The study is low-to-moderate quality for the  evaluation of pulmonary embolism, degraded by motion and suboptimal contrast opacification. There are no convincing filling defects in the central, lobar, segmental or subsegmental pulmonary artery branches to suggest acute pulmonary embolism. Normal course and caliber of the thoracic aorta. Prominently dilated main pulmonary artery (4.3 cm diameter). Top-normal heart size. No significant pericardial fluid/thickening. Left anterior descending coronary atherosclerosis. Mediastinum/Nodes: No discrete thyroid nodules. Unremarkable esophagus. No pathologically enlarged axillary, mediastinal or hilar lymph nodes. Lungs/Pleura: No pneumothorax. No pleural effusion. No acute consolidative airspace disease, lung masses or significant pulmonary nodules. Musculoskeletal: No aggressive appearing focal osseous lesions. Mild thoracic spondylosis. Review of the MIP images confirms the above findings. CT ABDOMEN and PELVIS FINDINGS Hepatobiliary: Severe diffuse hepatic steatosis. Diffusely irregular liver surface with relative hypertrophy of the caudate lobe, compatible with hepatic cirrhosis. Patchy lace-like enhancement throughout the liver, compatible with confluent hepatic fibrosis. No discrete liver mass. No radiopaque cholelithiasis. No definite gallbladder wall thickening. No pericholecystic fluid. No biliary ductal dilatation. Pancreas: Normal, with no mass or duct dilation. Spleen: Normal size. No mass. Adrenals/Urinary Tract: No discrete adrenal nodules. Normal kidneys with no hydronephrosis and no renal mass. Normal bladder. Stomach/Bowel: Normal non-distended stomach. Normal caliber small bowel with no small bowel wall thickening. Apparent appendectomy. Normal large bowel with no diverticulosis, large bowel wall thickening or pericolonic fat stranding. Vascular/Lymphatic: Atherosclerotic nonaneurysmal abdominal aorta. Patent portal, splenic, hepatic and renal veins. Moderate paraumbilical, small esophageal and large  left retroperitoneal (connecting the SMV to the left renal vein) varices. No pathologically enlarged lymph nodes in the abdomen or pelvis. Reproductive: Normal size prostate. Other: No pneumoperitoneum, ascites or focal fluid collection. Musculoskeletal: No aggressive appearing focal osseous lesions. Review of the MIP images confirms the above findings. IMPRESSION: 1. No evidence of pulmonary embolism. 2. Prominently dilated main pulmonary artery, suggesting pulmonary arterial hypertension. 3. Severe diffuse hepatic steatosis. Patchy lace-like enhancement throughout the liver, compatible with confluent hepatic fibrosis. Prominent morphologic changes of hepatic cirrhosis. No discrete liver mass. Suggest MRI abdomen without and with IV contrast for liver screening in 3-6 months. 4. Moderate paraumbilical, small esophageal and large left retroperitoneal varices, indicative of portal hypertension. Normal size spleen. No ascites. 5. No evidence of bowel obstruction or acute bowel inflammation. 6. One vessel coronary atherosclerosis. 7. Aortic Atherosclerosis (ICD10-I70.0). Electronically Signed   By: Delbert Phenix M.D.   On: 03/29/2019 12:49   Ct Abdomen Pelvis W Contrast  Result Date: 03/29/2019 CLINICAL DATA:  Dyspnea. Hilar prominence on chest radiograph. Right upper quadrant abdominal pain, nausea and vomiting. History of cirrhosis. EXAM: CT ANGIOGRAPHY CHEST CT ABDOMEN AND PELVIS WITH CONTRAST TECHNIQUE: Multidetector CT imaging of the chest was performed using the standard protocol during bolus administration of intravenous contrast. Multiplanar CT image reconstructions and MIPs were obtained to evaluate the vascular anatomy. Multidetector CT imaging of the abdomen and pelvis was performed using the standard protocol during bolus administration of intravenous contrast. CONTRAST:  OMNIPAQUE IOHEXOL 350 MG/ML SOLN COMPARISON:  Chest radiograph and right upper quadrant abdominal sonogram from earlier today.  FINDINGS: CTA CHEST FINDINGS Cardiovascular: The study is low-to-moderate quality for the evaluation of pulmonary embolism, degraded by  motion and suboptimal contrast opacification. There are no convincing filling defects in the central, lobar, segmental or subsegmental pulmonary artery branches to suggest acute pulmonary embolism. Normal course and caliber of the thoracic aorta. Prominently dilated main pulmonary artery (4.3 cm diameter). Top-normal heart size. No significant pericardial fluid/thickening. Left anterior descending coronary atherosclerosis. Mediastinum/Nodes: No discrete thyroid nodules. Unremarkable esophagus. No pathologically enlarged axillary, mediastinal or hilar lymph nodes. Lungs/Pleura: No pneumothorax. No pleural effusion. No acute consolidative airspace disease, lung masses or significant pulmonary nodules. Musculoskeletal: No aggressive appearing focal osseous lesions. Mild thoracic spondylosis. Review of the MIP images confirms the above findings. CT ABDOMEN and PELVIS FINDINGS Hepatobiliary: Severe diffuse hepatic steatosis. Diffusely irregular liver surface with relative hypertrophy of the caudate lobe, compatible with hepatic cirrhosis. Patchy lace-like enhancement throughout the liver, compatible with confluent hepatic fibrosis. No discrete liver mass. No radiopaque cholelithiasis. No definite gallbladder wall thickening. No pericholecystic fluid. No biliary ductal dilatation. Pancreas: Normal, with no mass or duct dilation. Spleen: Normal size. No mass. Adrenals/Urinary Tract: No discrete adrenal nodules. Normal kidneys with no hydronephrosis and no renal mass. Normal bladder. Stomach/Bowel: Normal non-distended stomach. Normal caliber small bowel with no small bowel wall thickening. Apparent appendectomy. Normal large bowel with no diverticulosis, large bowel wall thickening or pericolonic fat stranding. Vascular/Lymphatic: Atherosclerotic nonaneurysmal abdominal aorta. Patent  portal, splenic, hepatic and renal veins. Moderate paraumbilical, small esophageal and large left retroperitoneal (connecting the SMV to the left renal vein) varices. No pathologically enlarged lymph nodes in the abdomen or pelvis. Reproductive: Normal size prostate. Other: No pneumoperitoneum, ascites or focal fluid collection. Musculoskeletal: No aggressive appearing focal osseous lesions. Review of the MIP images confirms the above findings. IMPRESSION: 1. No evidence of pulmonary embolism. 2. Prominently dilated main pulmonary artery, suggesting pulmonary arterial hypertension. 3. Severe diffuse hepatic steatosis. Patchy lace-like enhancement throughout the liver, compatible with confluent hepatic fibrosis. Prominent morphologic changes of hepatic cirrhosis. No discrete liver mass. Suggest MRI abdomen without and with IV contrast for liver screening in 3-6 months. 4. Moderate paraumbilical, small esophageal and large left retroperitoneal varices, indicative of portal hypertension. Normal size spleen. No ascites. 5. No evidence of bowel obstruction or acute bowel inflammation. 6. One vessel coronary atherosclerosis. 7. Aortic Atherosclerosis (ICD10-I70.0). Electronically Signed   By: Delbert Phenix M.D.   On: 03/29/2019 12:49   Dg Chest Port 1 View  Result Date: 03/29/2019 CLINICAL DATA:  Cough and shortness of breath EXAM: PORTABLE CHEST 1 VIEW COMPARISON:  January 15, 2019 FINDINGS: There is no edema or consolidation. Heart is mildly enlarged with pulmonary vascularity grossly normal. There is fullness in each hilar region, particularly on the right. No other findings suggesting potential adenopathy. No bone lesions. IMPRESSION: No edema or consolidation.  Heart mildly enlarged. Prominence in the hilar regions may be due to prominent central pulmonary vessels. The possibility of hilar adenopathy, particular on the right, must be of concern. This area on the right appears more prominent than on prior study.  Given this concern for potential adenopathy, correlation with contrast enhanced chest CT to further evaluate is felt to be warranted. No other findings suggesting potential adenopathy. Electronically Signed   By: Bretta Bang III M.D.   On: 03/29/2019 08:49   US Abdomen Limited Ruq  Result Date: 03/29/2019 CLINICAL DATA:  Right upper quadrant pain. History of alcoholic cirrhosis. EXAM: ULTRASOUND ABDOMEN LIMITED RIGHT UPPER QUADRANT COMPARISON:  None. FINDINGS: Gallbladder: Gallstones measuring up to 1.2 cm. Gallbladder sludge. No gallbladder wall thickening. Negative sonographic Murphy's  sign. Common bile duct: Diameter: 3 mm. Liver: Diffusely increased echogenicity. No focal abnormality. Portal vein not visualized. Sonographer reports technical limitations of the exam secondary to body habitus. Other: None. IMPRESSION: Gallstones and gallbladder sludge. Echogenic liver compatible with hepatic steatosis Electronically Signed   By: Signa Kell M.D.   On: 03/29/2019 11:11       Subjective: Patient reports he feels well Tolerating p.o. intake Requested discharge home  Discharge Exam: Vitals:   03/31/19 0542 03/31/19 0645  BP: (!) 161/99 (!) 142/95  Pulse: (!) 102 86  Resp: 20   Temp: 98.4 F (36.9 C)   SpO2: 100%    Vitals:   03/30/19 1228 03/30/19 2213 03/31/19 0542 03/31/19 0645  BP: (!) 157/90 111/85 (!) 161/99 (!) 142/95  Pulse: 99 (!) 102 (!) 102 86  Resp: Temp: 98.9 F (37.2 C) 98.4 F (36.9 C) 98.4 F (36.9 C)   TempSrc: Oral Oral    SpO2: 97% 99% 100%   Weight:      Height:        General: Pt is alert, awake, not in acute distress Cardiovascular: RRR, S1/S2 +, no rubs, no gallops Respiratory: CTA bilaterally, no wheezing, no rhonchi Abdominal: Soft, NT, ND, bowel sounds + Extremities: no edema, no cyanosis    The results of significant diagnostics from this hospitalization (including imaging, microbiology, ancillary and laboratory) are listed  below for reference.     Microbiology: Recent Results (from the past 240 hour(s))  Culture, blood (routine x 2)     Status: None (Preliminary result)   Collection Time: 03/29/19  7:45 AM   Specimen: BLOOD LEFT ARM  Result Value Ref Range Status   Specimen Description   Final    BLOOD LEFT ARM Performed at [facility], [address] [place], [place] [address]    Special Requests   Final    BOTTLES DRAWN AEROBIC AND ANAEROBIC Blood Culture adequate volume Performed at [facility], [address] [place], [place] [address]    Culture   Final    NO GROWTH 2 DAYS Performed at [facility], [address], [place], [place] [address]    Report Status PENDING  Incomplete  Culture, blood (routine x 2)     Status: None (Preliminary result)   Collection Time: 03/29/19  7:55 AM   Specimen: BLOOD LEFT HAND  Result Value Ref Range Status   Specimen Description   Final    BLOOD LEFT HAND Performed at [facility], [address] [place], [place] [address]    Special Requests   Final    BOTTLES DRAWN AEROBIC AND ANAEROBIC Blood Culture adequate volume Performed at [facility], [address] [place], [place] [address]    Culture   Final    NO GROWTH 2 DAYS Performed at [facility], [address], [place], [place] [address]    Report Status PENDING  Incomplete  SARS CORONAVIRUS 2 (TAT 6-24 HRS) Nasopharyngeal Nasopharyngeal Swab     Status: None   Collection Time: 03/29/19  8:02 AM   Specimen: Nasopharyngeal Swab  Result Value Ref Range Status   SARS Coronavirus 2 NEGATIVE NEGATIVE Final    Comment: (NOTE) SARS-CoV-2 target nucleic acids are NOT DETECTED. The SARS-CoV-2 RNA is generally detectable in upper and lower respiratory specimens during the acute phase of infection. Negative results do not preclude SARS-CoV-2 infection, do  not rule out co-infections with other pathogens, and should not be used as the sole basis  for treatment or other patient management decisions. Negative results must be combined with clinical observations, patient history, and epidemiological information. The expected result is Negative. Fact Sheet for Patients: HairSlick.nohttps://www.fda.gov/media/138098/download Fact Sheet for Healthcare Providers: quierodirigir.comhttps://www.fda.gov/media/138095/download This test is not yet approved or cleared by the [place] FDA and  has been authorized for detection and/or diagnosis of SARS-CoV-2 by FDA under an Emergency Use Authorization (EUA). This EUA will remain  in effect (meaning this test can be used) for the duration of the COVID-19 declaration under Section 56 4(b)(1) of the Act, 21 U.S.C. section 360bbb-3(b)(1), unless the authorization is terminated or revoked sooner. Performed at [facility], [address] [place], [place] [address]      Labs: BNP (last 3 results) Recent Labs    03/29/19 0734  BNP 43.9   Basic Metabolic Panel: Recent Labs  Lab 03/29/19 0734 03/30/19 0419 03/31/19 0428  NA 140 138 139  K 3.2* 3.2* 3.4*  CL 105 105 107  CO2 22 24 26   GLUCOSE 107* 94 91  BUN 6 7 6   CREATININE 0.70 0.59* 0.63  CALCIUM 9.4 8.6* 8.3*  MG  --  1.6* 1.7  PHOS  --  3.1 2.9   Liver Function Tests: Recent Labs  Lab 03/29/19 0734 03/30/19 0419 03/31/19 0428  AST 188* 126* 104*  ALT 90* 76* 63*  ALKPHOS 148* 103 98  BILITOT 2.4* 3.1* 2.3*  PROT 7.6 6.9 6.3*  ALBUMIN 3.8 3.4* 3.1*   No results for input(s): LIPASE, AMYLASE in the last 168 hours. Recent Labs  Lab 03/29/19 2157 03/31/19 0428  AMMONIA 86* 41*   CBC: Recent Labs  Lab 03/29/19 0734 03/30/19 0419 03/31/19 0428  WBC 5.6 4.8 3.8*  NEUTROABS 3.0  --  2.4  HGB 17.3* 15.5 15.1  HCT 50.6 46.1 46.5  MCV 95.7 97.5 101.5*  PLT 61* 57* 47*   Cardiac Enzymes: No results for input(s): CKTOTAL, CKMB, CKMBINDEX, TROPONINI in the last 168 hours. BNP: Invalid input(s): POCBNP CBG: No results for  input(s): GLUCAP in the last 168 hours. D-Dimer No results for input(s): DDIMER in the last 72 hours. Hgb A1c No results for input(s): HGBA1C in the last 72 hours. Lipid Profile Recent Labs    03/30/19 0419  CHOL 94  HDL 26*  LDLCALC 58  TRIG 48  CHOLHDL 3.6   Thyroid function studies Recent Labs    03/30/19 0419  TSH 1.345   Anemia work up No results for input(s): VITAMINB12, FOLATE, FERRITIN, TIBC, IRON, RETICCTPCT in the last 72 hours. Urinalysis    Component Value Date/Time   COLORURINE AMBER (A) 03/29/2019 0926   APPEARANCEUR CLEAR 03/29/2019 0926   LABSPEC 1.020 03/29/2019 0926   PHURINE 6.5 03/29/2019 0926   GLUCOSEU NEGATIVE 03/29/2019 0926   HGBUR NEGATIVE 03/29/2019 0926   BILIRUBINUR MODERATE (A) 03/29/2019 0926   KETONESUR 15 (A) 03/29/2019 0926   PROTEINUR NEGATIVE 03/29/2019 0926   NITRITE NEGATIVE 03/29/2019 0926   LEUKOCYTESUR NEGATIVE 03/29/2019 0926   Sepsis Labs Invalid input(s): PROCALCITONIN,  WBC,  LACTICIDVEN Microbiology Recent Results (from the past 240 hour(s))  Culture, blood (routine x 2)     Status: None (Preliminary result)   Collection Time: 03/29/19  7:45 AM   Specimen: BLOOD LEFT ARM  Result Value Ref Range Status   Specimen Description   Final    BLOOD LEFT ARM Performed at  [facility], [address] [place], [place] [address]    Special Requests   Final    BOTTLES DRAWN AEROBIC AND ANAEROBIC Blood Culture adequate volume Performed at [facility], [address] [place], [place] [address]    Culture   Final    NO GROWTH 2 DAYS Performed at [facility], [address], [place], [place] [address]    Report Status PENDING  Incomplete  Culture, blood (routine x 2)     Status: None (Preliminary result)   Collection Time: 03/29/19  7:55 AM   Specimen: BLOOD LEFT HAND  Result Value Ref Range Status   Specimen Description   Final    BLOOD LEFT HAND Performed at [facility], [address] [place], [place] [address]    Special Requests   Final    BOTTLES DRAWN AEROBIC AND ANAEROBIC Blood Culture adequate volume Performed at [facility], [address] [place], [place] [address]    Culture   Final    NO GROWTH 2 DAYS Performed at [facility], [address] [place], [place] [address]    Report Status PENDING  Incomplete  SARS CORONAVIRUS 2 (TAT 6-24 HRS) Nasopharyngeal Nasopharyngeal Swab     Status: None   Collection Time: 03/29/19  8:02 AM   Specimen: Nasopharyngeal Swab  Result Value Ref Range Status   SARS Coronavirus 2 NEGATIVE NEGATIVE Final    Comment: (NOTE) SARS-CoV-2 target nucleic acids are NOT DETECTED. The SARS-CoV-2 RNA is generally detectable in upper and lower respiratory specimens during the acute phase of infection. Negative results do not preclude SARS-CoV-2 infection, do not rule out co-infections with other pathogens, and should not be used as the sole basis for treatment or other patient management decisions. Negative results must be combined with clinical observations, patient history, and epidemiological information. The expected result is Negative. Fact Sheet for Patients: HairSlick.no Fact Sheet for Healthcare Providers: quierodirigir.com This test is not yet approved or cleared by the [place] FDA and  has been authorized for detection and/or diagnosis of SARS-CoV-2 by FDA under an Emergency Use Authorization (EUA). This EUA will remain  in effect (meaning this test can be used) for the duration of the COVID-19 declaration under Section 56 4(b)(1) of the Act, 21 U.S.C. section 360bbb-3(b)(1), unless the authorization is terminated or revoked sooner. Performed at [facility], [address], [place], [place] [address]      Time coordinating discharge: Over 30 minutes  SIGNED:   Burke Keels, MD  [facility] 03/31/2019, 11:26 AM Pager   If 7PM-7AM, please contact night-coverage [email or web address] Password [email or web address]

## 2019-03-31 NOTE — Progress Notes (Signed)
Pt report itching with morphine administration. Given oxy by this nurse after report.

## 2019-04-03 LAB — CULTURE, BLOOD (ROUTINE X 2)
Culture: NO GROWTH
Culture: NO GROWTH
Special Requests: ADEQUATE
Special Requests: ADEQUATE

## 2019-04-16 ENCOUNTER — Emergency Department (HOSPITAL_COMMUNITY)
Admission: EM | Admit: 2019-04-16 | Discharge: 2019-04-16 | Discharge status: Against Medical Advice | Payer: Self-pay | Attending: Emergency Medicine | Admitting: Emergency Medicine

## 2019-04-16 DIAGNOSIS — Z5321 Procedure and treatment not carried out due to patient leaving prior to being seen by health care provider: Secondary | ICD-10-CM | POA: Insufficient documentation

## 2019-04-16 NOTE — ED Triage Notes (Signed)
Pt here from home with c/o right lower quadrant pain , pt ws here 2 weeks ago had [place] and C CT which were "ok" , no urinary symptoms

## 2020-08-08 IMAGING — CT CT ABD-PELV W/ CM
2 of 14 series · 11 of 46 positions shown, 17 images · IV contrast (Omnipaque)
Comparison: Chest radiograph and right upper quadrant abdominal
sonogram from earlier today.

CLINICAL DATA: Dyspnea. Hilar prominence on chest radiograph. Right
upper quadrant abdominal pain, nausea and vomiting. History of
cirrhosis.

EXAM:
CT ANGIOGRAPHY CHEST
CT ABDOMEN AND PELVIS WITH CONTRAST
TECHNIQUE: Multidetector CT imaging of the chest was performed using the
standard protocol during bolus administration of intravenous
contrast. Multiplanar CT image reconstructions and MIPs were
obtained to evaluate the vascular anatomy. Multidetector CT imaging
of the abdomen and pelvis was performed using the standard protocol
during bolus administration of intravenous contrast.
CONTRAST:  100mL OMNIPAQUE IOHEXOL 350 MG/ML SOLN

[Series 6: pe coronal mpr · coronal · 0.63mm/px · 1 of 153 slices shown, 2 images]
[im 77/153  soft-tissue]
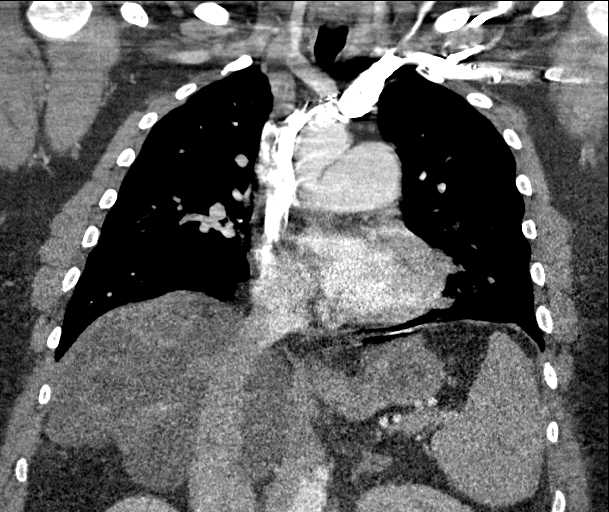
[im 77/153  bone]
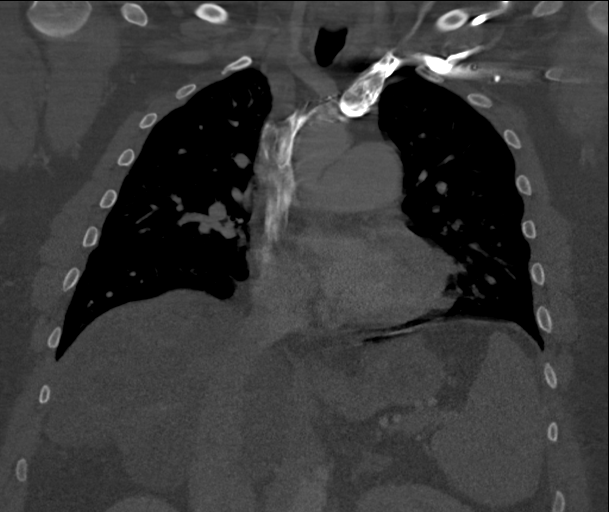

[Series 13: abd/pelvis thins · axial · 0.92mm/px · z∈[-658,-232]mm · 10 of 498 slices shown, 15 images]
[im 36/498  soft-tissue]
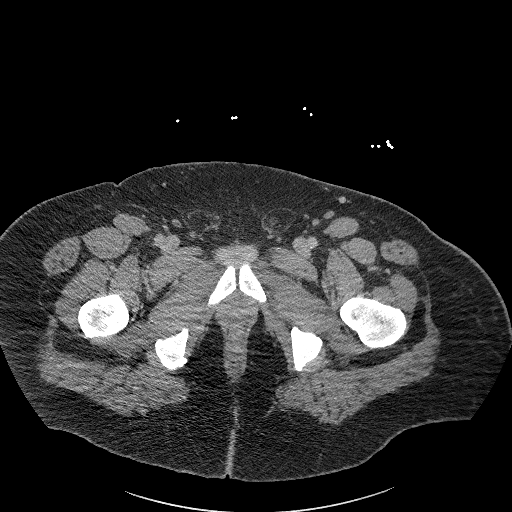
[im 36/498  bone]
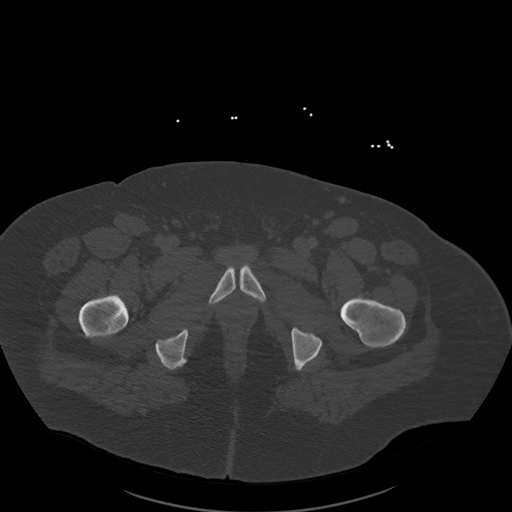
[im 107/498  soft-tissue]
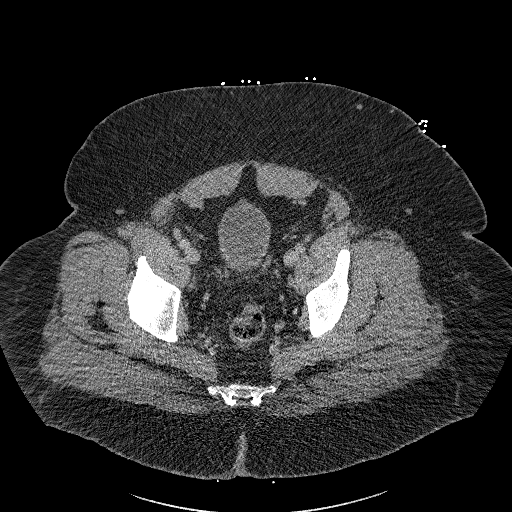
[im 143/498  soft-tissue]
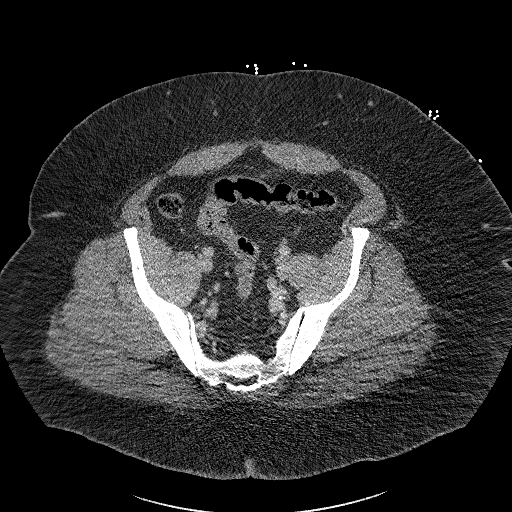
[im 214/498  soft-tissue]
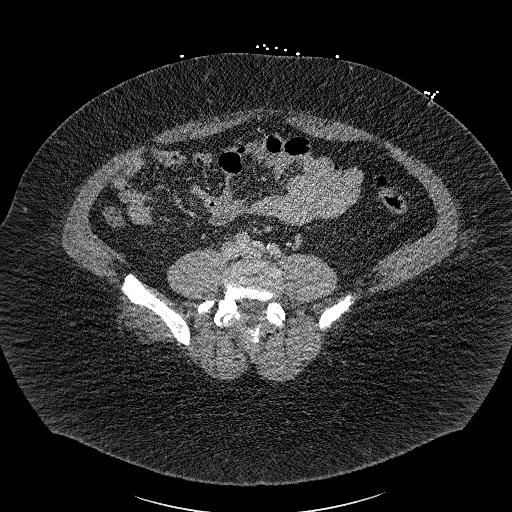
[im 249/498  soft-tissue]
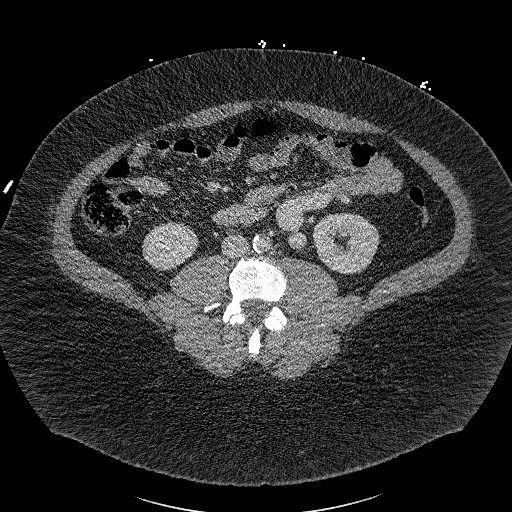
[im 285/498  soft-tissue]
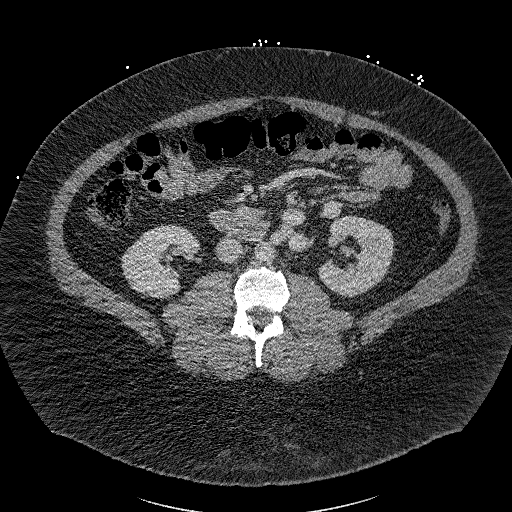
[im 356/498  soft-tissue]
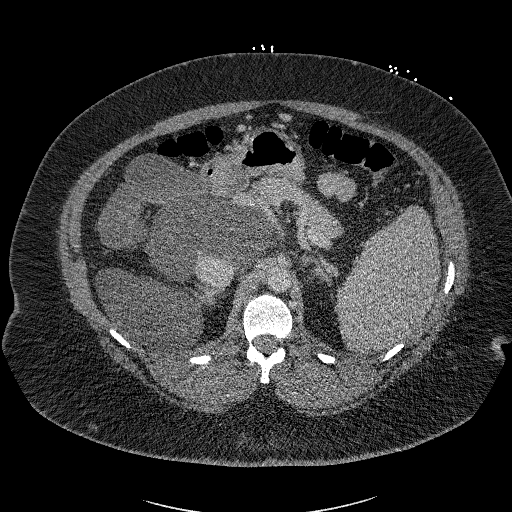
[im 356/498  lung]
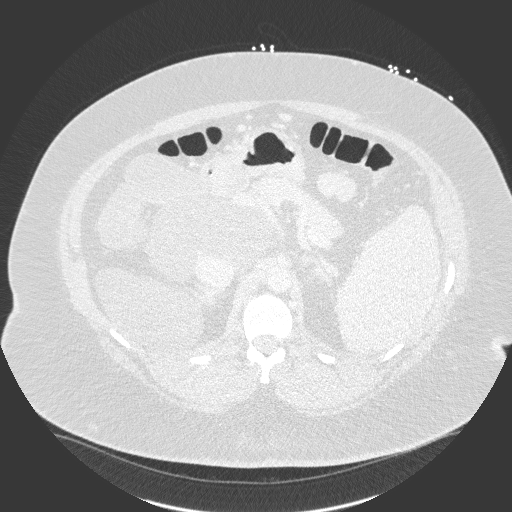
[im 391/498  soft-tissue]
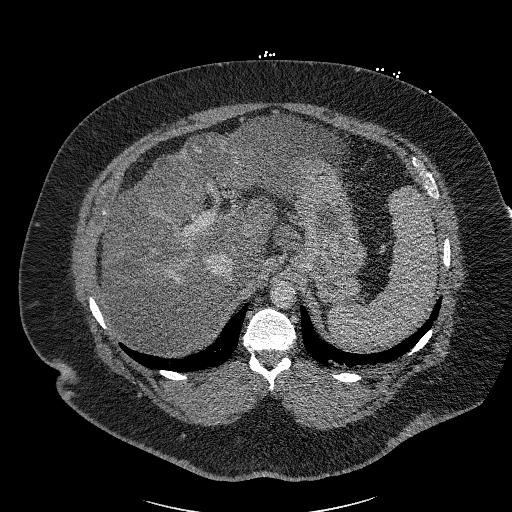
[im 391/498  lung]
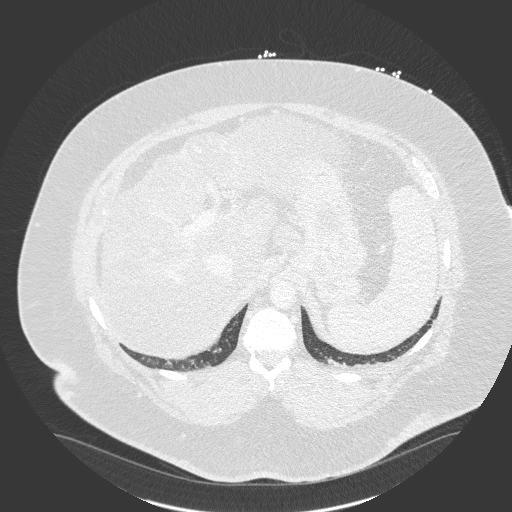
[im 427/498  lung]
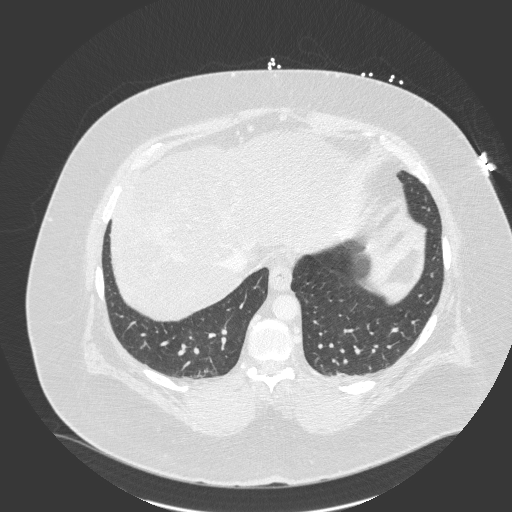
[im 462/498  soft-tissue]
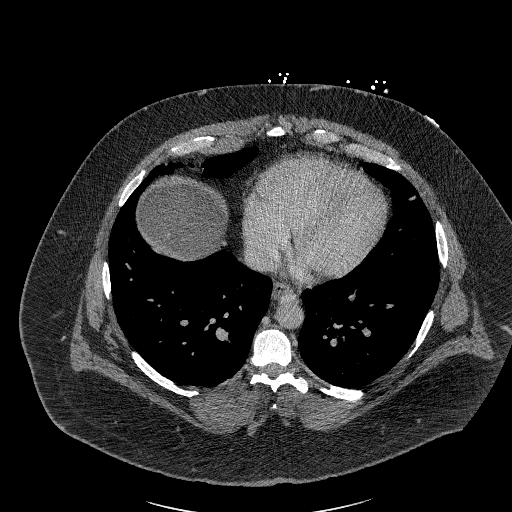
[im 462/498  lung]
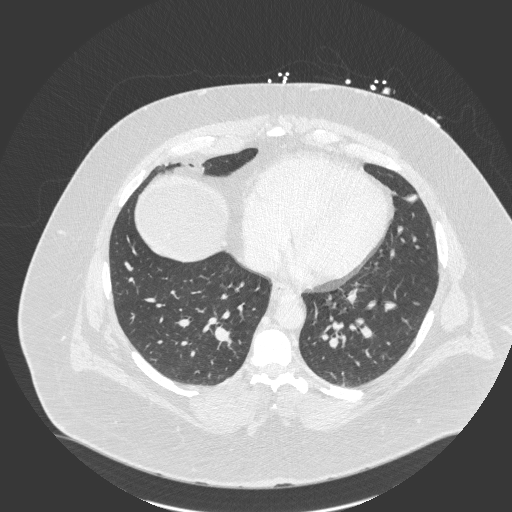
[im 462/498  bone]
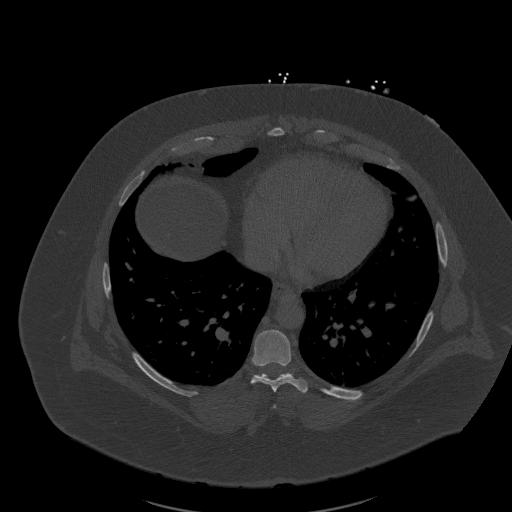

[11 of 46 positions shown; findings below may reference images not displayed]

FINDINGS: CTA CHEST FINDINGS

Cardiovascular: The study is low-to-moderate quality for the
evaluation of pulmonary embolism, degraded by motion and suboptimal
contrast opacification. There are no convincing filling defects in
the central, lobar, segmental or subsegmental pulmonary artery
branches to suggest acute pulmonary embolism. Normal course and
caliber of the thoracic aorta. Prominently dilated main pulmonary
artery (4.3 cm diameter). Top-normal heart size. No significant
pericardial fluid/thickening. Left anterior descending coronary
atherosclerosis.

Mediastinum/Nodes: No discrete thyroid nodules. Unremarkable
esophagus. No pathologically enlarged axillary, mediastinal or hilar
lymph nodes.

Lungs/Pleura: No pneumothorax. No pleural effusion. No acute
consolidative airspace disease, lung masses or significant pulmonary
nodules.

Musculoskeletal: No aggressive appearing focal osseous lesions. Mild
thoracic spondylosis.

Review of the MIP images confirms the above findings.

CT ABDOMEN and PELVIS FINDINGS

Hepatobiliary: Severe diffuse hepatic steatosis. Diffusely irregular
liver surface with relative hypertrophy of the caudate lobe,
compatible with hepatic cirrhosis. Patchy lace-like enhancement
throughout the liver, compatible with confluent hepatic fibrosis. No
discrete liver mass. No radiopaque cholelithiasis. No definite
gallbladder wall thickening. No pericholecystic fluid. No biliary
ductal dilatation.

Pancreas: Normal, with no mass or duct dilation.

Spleen: Normal size. No mass.

Adrenals/Urinary Tract: No discrete adrenal nodules. Normal kidneys
with no hydronephrosis and no renal mass. Normal bladder.

Stomach/Bowel: Normal non-distended stomach. Normal caliber small
bowel with no small bowel wall thickening. Apparent appendectomy.
Normal large bowel with no diverticulosis, large bowel wall
thickening or pericolonic fat stranding.

Vascular/Lymphatic: Atherosclerotic nonaneurysmal abdominal aorta.
Patent portal, splenic, hepatic and renal veins. Moderate
paraumbilical, small esophageal and large left retroperitoneal
(connecting the SMV to the left renal vein) varices. No
pathologically enlarged lymph nodes in the abdomen or pelvis.

Reproductive: Normal size prostate.

Other: No pneumoperitoneum, ascites or focal fluid collection.

Musculoskeletal: No aggressive appearing focal osseous lesions.

Review of the MIP images confirms the above findings.
IMPRESSION: 1. No evidence of pulmonary embolism.
2. Prominently dilated main pulmonary artery, suggesting pulmonary
arterial hypertension.
3. Severe diffuse hepatic steatosis. Patchy lace-like enhancement
throughout the liver, compatible with confluent hepatic fibrosis.
Prominent morphologic changes of hepatic cirrhosis. No discrete
liver mass. Suggest MRI abdomen without and with IV contrast for
liver screening in 3-6 months.
4. Moderate paraumbilical, small esophageal and large left
retroperitoneal varices, indicative of portal hypertension. Normal
size spleen. No ascites.
5. No evidence of bowel obstruction or acute bowel inflammation.
6. One vessel coronary atherosclerosis.
7. Aortic Atherosclerosis ([id number]).
